# Patient Record
Sex: Female | Born: 1955 | Race: White | Hispanic: No | Smoking: Current every day smoker
Health system: Southern US, Community
[De-identification: ages and names within clinical notes are randomized; demographics above are authoritative.]

## PROBLEM LIST (undated history)

## (undated) DIAGNOSIS — F329 Major depressive disorder, single episode, unspecified: Secondary | ICD-10-CM

## (undated) DIAGNOSIS — F419 Anxiety disorder, unspecified: Secondary | ICD-10-CM

## (undated) DIAGNOSIS — Z8719 Personal history of other diseases of the digestive system: Secondary | ICD-10-CM

## (undated) DIAGNOSIS — R51 Headache: Secondary | ICD-10-CM

## (undated) DIAGNOSIS — K219 Gastro-esophageal reflux disease without esophagitis: Secondary | ICD-10-CM

## (undated) DIAGNOSIS — M1990 Unspecified osteoarthritis, unspecified site: Secondary | ICD-10-CM

## (undated) DIAGNOSIS — E079 Disorder of thyroid, unspecified: Secondary | ICD-10-CM

## (undated) DIAGNOSIS — E039 Hypothyroidism, unspecified: Secondary | ICD-10-CM

## (undated) DIAGNOSIS — R0602 Shortness of breath: Secondary | ICD-10-CM

## (undated) DIAGNOSIS — F32A Depression, unspecified: Secondary | ICD-10-CM

## (undated) DIAGNOSIS — I10 Essential (primary) hypertension: Secondary | ICD-10-CM

## (undated) DIAGNOSIS — J449 Chronic obstructive pulmonary disease, unspecified: Secondary | ICD-10-CM

## (undated) DIAGNOSIS — G4730 Sleep apnea, unspecified: Secondary | ICD-10-CM

## (undated) HISTORY — PX: CHOLECYSTECTOMY: SHX55

## (undated) HISTORY — PX: CARDIOVASCULAR STRESS TEST: SHX262

## (undated) HISTORY — PX: ABDOMINAL HYSTERECTOMY: SHX81

## (undated) HISTORY — PX: KNEE ARTHROSCOPY: SUR90

## (undated) HISTORY — PX: CARDIAC CATHETERIZATION: SHX172

---

## 2005-05-31 ENCOUNTER — Ambulatory Visit: Payer: Self-pay | Admitting: Cardiology

## 2005-06-01 ENCOUNTER — Inpatient Hospital Stay (HOSPITAL_COMMUNITY): Admission: AD | Admit: 2005-06-01 | Discharge: 2005-06-03 | Payer: Self-pay | Admitting: Cardiology

## 2005-06-02 ENCOUNTER — Ambulatory Visit: Payer: Self-pay | Admitting: Cardiology

## 2005-06-26 ENCOUNTER — Ambulatory Visit: Payer: Self-pay | Admitting: Cardiology

## 2006-12-24 ENCOUNTER — Ambulatory Visit (HOSPITAL_COMMUNITY): Admission: RE | Admit: 2006-12-24 | Discharge: 2006-12-24 | Payer: Self-pay | Admitting: *Deleted

## 2006-12-26 ENCOUNTER — Emergency Department (HOSPITAL_COMMUNITY): Admission: EM | Admit: 2006-12-26 | Discharge: 2006-12-26 | Payer: Self-pay | Admitting: Emergency Medicine

## 2009-03-22 ENCOUNTER — Ambulatory Visit: Payer: Self-pay | Admitting: Cardiology

## 2009-04-11 ENCOUNTER — Emergency Department (HOSPITAL_COMMUNITY): Admission: EM | Admit: 2009-04-11 | Discharge: 2009-04-11 | Payer: Self-pay | Admitting: Emergency Medicine

## 2009-04-22 ENCOUNTER — Ambulatory Visit (HOSPITAL_COMMUNITY): Admission: RE | Admit: 2009-04-22 | Discharge: 2009-04-22 | Payer: Self-pay | Admitting: Family Medicine

## 2010-09-11 ENCOUNTER — Encounter: Payer: Self-pay | Admitting: Family Medicine

## 2011-01-06 NOTE — Discharge Summary (Signed)
NAMERYANN, PAULI               ACCOUNT NO.:  [id number]   MEDICAL RECORD NO.:  [id number]          PATIENT TYPE:  INP   LOCATION:  4709                         FACILITY:  [facility]   PHYSICIAN:  April Humphrey, NP     DATE OF BIRTH:  [birth year]   DATE OF ADMISSION:  06/01/2005  DATE OF DISCHARGE:  06/03/2005                                 DISCHARGE SUMMARY   DISCHARGING CARDIOLOGIST:  Jonelle Sidle, MD, [facility].   PRINCIPAL DIAGNOSIS:  Chest pain with lateral ST elevation.   SECONDARY DIAGNOSES:  1.  Tobacco abuse/chronic obstructive pulmonary disease.  2.  Morbid obesity.  3.  Hypertension.  4.  Unknown lipid status.   PROCEDURES PERFORMED:  1.  June 02, 2005, left heart catheterization, coronaries,      ventriculogram.  2.  Chest CT on June 02, 2005.   ALLERGIES:  1.  PENICILLIN.  2.  IODINE.  3.  SULFA DRUGS.  4.  CODEINE.  5.  AVALOX.  6.  MORPHINE.  7.  IV DYE.   HISTORY OF PRESENT ILLNESS:  Patient is a 55 year old Caucasian female with  multiple medical problems.  She was originally admitted to [facility] on May 31, 2005, with a history of dizziness and presyncopal  episodes.  The patient also described substernal pressure, burning, and  radiation.  Her troponins at [facility] were negative x3.  The patient had a  dobutamine study with ST changes on May 31, 2005, at [facility] and was  transferred to the ICU on IV nitroglycerin and heparin but was decided to be  transferred to [facility] for additional cardiac catheterization.  The  patient indicates an IV DYE ALLERGY, also.  The patient was transferred on  June 01, 2005, to [facility] for a diagnostic cath in stable condition.  On June 02, 2005, the patient had a cardiac catheterization that showed  no significant obstructive CAD, next with an LVEF of 65%.  The patient was  then transferred to telemetry.  A chest CT was performed on October 14th to  exclude a PE.  On June 03, 2005, Dr. Diona Browner went to examine the  patient.  The CT showed negative PE or any other acute abnormality.  The  troponins were negative and chest pain was resolved.  The patient was stable  at that point to be discharged home.   DISCHARGE LABORATORY DATA:  CK-MBs were negative.  CBC:  White blood cell  count of 15.7, hemoglobin of 15.0, hematocrit of 44.0, platelet count of  353.   FOLLOWUP:  The patient will be followed up by Dr. Andee Lineman on November 6th at  2:45 for a post-hospital visit.  The patient is also to follow up with Dr.  Reuel Boom and is to call to schedule. Also, patient was also advised to try to  get an appointment with a pulmonologist for evaluation of her lung status.   DISCHARGE MEDICATIONS:  1.  Hyzaar 100/25 mg p.o. daily.  2.  Cardizem-CD 360 mg p.o. daily.  3.  Synthroid 0.6 mcg p.o. daily.  4.  Nexium  40 mg p.o. daily.  5.  Spiriva 18 mcg every day inhaler.  6.  Albuterol inhaler as previously ordered.  7.  Flexeril and Tylox as previously ordered.  8.  Aspirin 81 mg p.o. daily.  \.   INSTRUCTIONS:  The patient was also instructed to stop smoking and to eat a  heart-healthy diet.  The patient was instructed not to drive for two days,  no lifting for one week, may shower today, no bathing for one week.   TOTAL DURATION OF ENCOUNTER:  Less than 30 minutes.           ______________________________  April Humphrey, NP     AH/MEDQ  D:  06/03/2005  T:  06/03/2005  Job:  [id number]   cc:   Learta Codding, M.D. [facility]  [address]  [address]  [place]  [place] [address]   Donzetta Sprung  Fax: [phone]

## 2011-01-06 NOTE — Cardiovascular Report (Signed)
NAMERAYMONDE, Gabrielle Castillo               ACCOUNT NO.:  [id number]   MEDICAL RECORD NO.:  [id number]          PATIENT TYPE:  INP   LOCATION:  2913                         FACILITY:  [facility]   PHYSICIAN:  Jonelle Sidle, M.D. LHCDATE OF BIRTH:  Gabrielle Castillo, Gabrielle Castillo   DATE OF PROCEDURE:  06/02/2005  DATE OF DISCHARGE:                              CARDIAC CATHETERIZATION   PRIMARY CARE PHYSICIAN:  Dr. Donzetta Sprung.   PRIMARY CARDIOLOGIST:  Dr. Learta Codding.   INDICATION:  Gabrielle Castillo is a 55 year old woman with history of morbid  obesity with probable obstructive sleep apnea, hypertension, ongoing tobacco  use with COPD, gastroesophageal reflux disease, and recent presentation to  [facility] with chest discomfort.  She ruled out for  myocardial infarction and was subsequently referred for a dobutamine Myoview  study.  This study had to be discontinued due to development of chest pain  and per Dr. Margarita Mail report, the development of hyperacute T wave changes  (not ST elevation as has been described in the chart).  She is transferred  now to the [name] in anticipation of diagnostic coronary  angiography to clearly outline the coronary anatomy.  She does report an  anaphylactic contrast dye allergy in the past approximately 10 years ago.  She has been pretreated with prednisone, Pepcid and Benadryl, and has signed  her informed consent to proceed.   PROCEDURES PERFORMED:  1.  Left heart catheterization.  2.  Selective coronary angiography.  3.  Left ventriculography.   ACCESS AND EQUIPMENT:  The area about the right femoral artery was  anesthetized with 1% lidocaine and a 6-French sheath was placed in the right  femoral artery via the modified Seldinger technique.  Standard preformed 6-  [place] and JR4 catheters were used for selective coronary angiography  and an angled pigtail catheter was used for left heart catheterization and  left ventriculography.  All  exchanges were made over a wire and the patient  tolerated the procedure well without immediate complications.   HEMODYNAMIC RESULTS:  Left ventricle 148/12 mmHg.  Aorta 144/83 mmHg.   ANGIOGRAPHIC FINDINGS:  1.  The left main coronary artery is free of significant flow-limiting      coronary atherosclerosis and gives rise to the left anterior descending      and circumflex coronary arteries.  2.  The left anterior descending is a medium-to-large-caliber vessel with 2      large proximal diagonal branches.  No significant flow-limiting coronary      atherosclerosis is noted within this system.  3.  The circumflex coronary artery is a large codominant vessel.  There is a      large proximal obtuse marginal branch and 2 smaller more distal obtuse      marginal branches as well as a posterior descending branch.  No      significant flow-limiting coronary atherosclerosis is noted with only      minor luminal irregularities.  4.  The right coronary artery is a small-to-medium-caliber vessel that is      codominant with the circumflex.  A conus branch  is noted near the      ostium.  There are 2 small right ventricular marginal branches noted.      No significant flow-limiting coronary atherosclerosis is noted within      this system.   Left ventriculography was performed in the RAO projection and reveals an  ejection fraction of approximately 65% with no anterior apical or inferior  wall motion abnormalities and no significant mitral regurgitation.   DIAGNOSES:  1.  No significant flow-limiting coronary atherosclerosis noted in the major      epicardial vessels as outlined above.  The coronary system is      codominant.  2.  Left ventricular ejection fraction of approximately 65% with no anterior      apical or inferior wall motion abnormalities and a left ventricular end-      diastolic pressure of 12 mmHg.   DISCUSSION:  Plan will be for the patient to be transferred to the telemetry   unit with followup lab work in the morning as well as electrocardiogram.  She will be observed overnight and we will plan a CT scan of the chest with  contrast for tomorrow following adequate contrast dye allergy prophylaxis to  exclude possibilities of pulmonary mass, pulmonary embolus and abnormalities  of the great vessels.  If this is negative, she will likely be discharged  for close outpatient followup.           ______________________________  Jonelle Sidle, M.D. [facility]     SGM/MEDQ  D:  06/02/2005  T:  06/02/2005  Job:  [id number]   cc:   Donzetta Sprung  Fax: [phone]   Learta Codding, M.D. [facility]  [address]  [address]  [place]  [place] [address]

## 2011-07-05 ENCOUNTER — Emergency Department (HOSPITAL_COMMUNITY): Payer: Medicare Other

## 2011-07-05 ENCOUNTER — Encounter: Payer: Self-pay | Admitting: *Deleted

## 2011-07-05 ENCOUNTER — Emergency Department (HOSPITAL_COMMUNITY)
Admission: EM | Admit: 2011-07-05 | Discharge: 2011-07-05 | Disposition: A | Discharge status: Home / Self Care | Payer: Medicare Other | Attending: Emergency Medicine | Admitting: Emergency Medicine

## 2011-07-05 DIAGNOSIS — S7001XA Contusion of right hip, initial encounter: Secondary | ICD-10-CM

## 2011-07-05 DIAGNOSIS — W19XXXA Unspecified fall, initial encounter: Secondary | ICD-10-CM | POA: Insufficient documentation

## 2011-07-05 DIAGNOSIS — S7000XA Contusion of unspecified hip, initial encounter: Secondary | ICD-10-CM | POA: Insufficient documentation

## 2011-07-05 DIAGNOSIS — M79609 Pain in unspecified limb: Secondary | ICD-10-CM | POA: Insufficient documentation

## 2011-07-05 HISTORY — DX: Disorder of thyroid, unspecified: E07.9

## 2011-07-05 HISTORY — DX: Chronic obstructive pulmonary disease, unspecified: J44.9

## 2011-07-05 HISTORY — DX: Essential (primary) hypertension: I10

## 2011-07-05 HISTORY — DX: Unspecified osteoarthritis, unspecified site: M19.90

## 2011-07-05 LAB — POCT I-STAT, CHEM 8
BUN: 9 mg/dL (ref 6–23)
Calcium, Ion: 1.13 mmol/L (ref 1.12–1.32)
Chloride: 105 mEq/L (ref 96–112)
Creatinine, Ser: 0.7 mg/dL (ref 0.50–1.10)
Glucose, Bld: 118 mg/dL — ABNORMAL HIGH (ref 70–99)
HCT: 42 % (ref 36.0–46.0)
Hemoglobin: 14.3 g/dL (ref 12.0–15.0)
Potassium: 3.5 mEq/L (ref 3.5–5.1)
Sodium: 143 mEq/L (ref 135–145)
TCO2: 23 mmol/L (ref 0–100)

## 2011-07-05 MED ORDER — HYDROCODONE-ACETAMINOPHEN 5-500 MG PO TABS: 1.0000 | ORAL_TABLET | Freq: Four times a day (QID) | ORAL | Status: AC | PRN

## 2011-07-05 MED ORDER — HYDROMORPHONE HCL PF 2 MG/ML IJ SOLN
INTRAMUSCULAR | Status: AC
  Administered 2011-07-05: 1 mg
  Filled 2011-07-05: qty 1

## 2011-07-05 MED ORDER — ACETAMINOPHEN 325 MG PO TABS
650.0000 mg | ORAL_TABLET | Freq: Once | ORAL | Status: AC
  Administered 2011-07-05: 650 mg via ORAL
  Filled 2011-07-05: qty 2

## 2011-07-05 MED ORDER — HYDROMORPHONE HCL PF 1 MG/ML IJ SOLN
1.0000 mg | Freq: Once | INTRAMUSCULAR | Status: AC
  Administered 2011-07-05: 1 mg via INTRAVENOUS
  Filled 2011-07-05: qty 1

## 2011-07-05 MED ORDER — ONDANSETRON 8 MG PO TBDP
8.0000 mg | ORAL_TABLET | Freq: Once | ORAL | Status: AC
  Administered 2011-07-05: 8 mg via ORAL
  Filled 2011-07-05: qty 2

## 2011-07-05 MED ORDER — HYDROMORPHONE HCL PF 1 MG/ML IJ SOLN
1.0000 mg | Freq: Once | INTRAMUSCULAR | Status: AC
  Administered 2011-07-05: 1 mg via INTRAMUSCULAR

## 2011-07-05 NOTE — ED Notes (Signed)
Assessment of patient: Complaints of right later thigh pain. No deformity. Strong petal pulses. No discoloration. Sharp in nature that is constant; movement makes pain worse. Nothing makes pain better. Lower back pain as well that is sharp and constant. Movement makes pain worse. Nothing makes pain better. Equal chest rise and fall. Lung sounds clear and equal bilaterally. No respiratory distress.

## 2011-07-05 NOTE — ED Notes (Signed)
Pt back from x-ray.

## 2011-07-05 NOTE — ED Notes (Signed)
MD at bedside. 

## 2011-07-05 NOTE — ED Notes (Signed)
Called ems to pick up pt, they are sending someone

## 2011-07-05 NOTE — ED Notes (Addendum)
Had a fall around 0430 this morning. Was moving from a computer chair back to her wheelchair with her cane when the cane buckled and she fell to her knees. Denies hitting head or losing consciousness. Refused C-Collar by EMS. Was laying flat in floor when EMS arrived. Complaints of right lateral thigh pain and lower back pain. No deformity. Strong petal pulses. States she has had the trouble for a long time of not having any feeling in bilateral arms and legs.

## 2011-07-05 NOTE — ED Notes (Signed)
Patient transported to X-ray via stretcher 

## 2011-07-05 NOTE — ED Notes (Signed)
MD at bedside. Jenette, RN at bedside to draw blood.

## 2011-07-05 NOTE — ED Provider Notes (Signed)
History     CSN: [id number] Arrival date & time: 07/05/2011  6:21 AM   First MD Initiated Contact with Patient 07/05/11 0617      Chief Complaint  Patient presents with  . Fall    (Consider location/radiation/quality/duration/timing/severity/associated sxs/prior treatment) Patient is a 55 y.o. female presenting with fall. The history is provided by the patient and the EMS personnel.  Fall The accident occurred less than 1 hour ago. The fall occurred while walking. Distance fallen: Ground level. She landed on carpet. There was no blood loss. The point of impact was the right hip. The pain is present in the right hip. The pain is moderate. She was not ambulatory at the scene. There was no entrapment after the fall. There was no drug use involved in the accident. There was no alcohol use involved in the accident. Pertinent negatives include no visual change, no fever, no numbness, no abdominal pain, no bowel incontinence, no nausea, no vomiting, no hematuria, no headaches, no loss of consciousness and no tingling. The symptoms are aggravated by use of the injured limb. Prehospitalization: Patient declined a c-collar, backboard. No IV or medications provided. She has tried nothing for the symptoms.   Patient uses a wheelchair at home as needed and also a 4 pronged cane. She is getting out of her wheelchair and using her cane to get her computer desk when she fell landing on her right hip and now has pain in the area of her right hip. No weakness or tingling. Family called EMS and she did not try to get up. She denies any other pain or injury. Specifically no neck pain or head pain. She denies striking her head. She did not injure her arm wrist or elbow. She has a history of back pain that persists at night but she denies any new back pain. She has been seeing her doctor, DR Reuel Boom in [place], and recently with a complaint of numbness in her extremities. He has given her a prescription for the wheelchair  has been checking blood work. Patient is unaware of the results of her blood work. She denies any recent illness. She denies any weakness in her arms or legs. This has been gone for a matter of weeks. No fevers or GI illness or history of Guillian Barre syndrome. She has not seen a neurologist. Her symptoms are persistent but not progressive. She is requesting something for pain.  Past Medical History  Diagnosis Date  . COPD (chronic obstructive pulmonary disease)   . Hypertension   . Thyroid disease   . Emphysema   . Asthma   . Degenerative joint disease     Past Surgical History  Procedure Date  . Abdominal hysterectomy   . Cholecystectomy   . Cardiac catheterization     three times  . Cardiac catheterization     History reviewed. No pertinent family history.  History  Substance Use Topics  . Smoking status: Unknown If Ever Smoked  . Smokeless tobacco: Not on file  . Alcohol Use: Not on file    OB History    Grav Para Term Preterm Abortions TAB SAB Ect Mult Living                  Review of Systems  Constitutional: Negative for fever and chills.  HENT: Negative for neck pain and neck stiffness.   Eyes: Negative for pain.  Respiratory: Negative for shortness of breath.   Cardiovascular: Negative for chest pain, palpitations and leg  swelling.  Gastrointestinal: Negative for nausea, vomiting, abdominal pain and bowel incontinence.  Genitourinary: Negative for dysuria and hematuria.  Musculoskeletal: Negative for back pain.  Skin: Negative for rash.  Neurological: Negative for tingling, loss of consciousness, numbness and headaches.  All other systems reviewed and are negative.    Allergies  Iohexol  Home Medications   Current Outpatient Rx  Name Route Sig Dispense Refill  . AMLODIPINE-OLMESARTAN 10-40 MG PO TABS Oral Take 1 tablet by mouth daily.      . BUPROPION HCL ER (XL) 150 MG PO TB24 Oral Take 150 mg by mouth daily.      Marland Kitchen FLUTICASONE-SALMETEROL 115-21  MCG/ACT IN AERO Inhalation Inhale 2 puffs into the lungs 2 (two) times daily.      . FUROSEMIDE 40 MG PO TABS Oral Take 40 mg by mouth daily.      Marland Kitchen LEVOTHYROXINE SODIUM 300 MCG PO TABS Oral Take 600 mcg by mouth daily.      Marland Kitchen LORAZEPAM 0.5 MG PO TABS Oral Take 0.5 mg by mouth every 8 (eight) hours.      Marland Kitchen METHOCARBAMOL 750 MG PO TABS Oral Take 750 mg by mouth 3 (three) times daily as needed.      Marland Kitchen NABUMETONE 750 MG PO TABS Oral Take 1,500 mg by mouth daily.      Marland Kitchen OMEPRAZOLE 20 MG PO CPDR Oral Take 20 mg by mouth daily.      . PSYLLIUM 0.52 G PO CAPS Oral Take 0.52 g by mouth daily.      Marland Kitchen TIOTROPIUM BROMIDE MONOHYDRATE 18 MCG IN CAPS Inhalation Place 18 mcg into inhaler and inhale daily.      . TOLTERODINE TARTRATE 4 MG PO CP24 Oral Take 4 mg by mouth daily.        BP 147/70  Pulse 98  Temp(Src) 97.9 F (36.6 C) (Oral)  Resp 20  Ht 5\' 7"  (1.702 m)  Wt 358 lb (162.388 kg)  BMI 56.07 kg/m2  SpO2 97%  Physical Exam  Constitutional: She is oriented to person, place, and time. She appears well-developed and well-nourished.  HENT:  Head: Normocephalic and atraumatic.  Eyes: Conjunctivae and EOM are normal. Pupils are equal, round, and reactive to light.  Neck: Trachea normal. Neck supple. No thyromegaly present.       No midline cervical, thoracic or lumbar tenderness or deformity.  Cardiovascular: Normal rate, regular rhythm, S1 normal, S2 normal and normal pulses.     No systolic murmur is present   No diastolic murmur is present  Pulses:      Radial pulses are 2+ on the right side, and 2+ on the left side.  Pulmonary/Chest: Effort normal and breath sounds normal. She has no wheezes. She has no rhonchi. She has no rales. She exhibits no tenderness.  Abdominal: Soft. Normal appearance and bowel sounds are normal. There is no tenderness. There is no CVA tenderness and negative Murphy's sign.  Musculoskeletal:       Pelvis stable. Tender over area of right greater trochanter. No  lower extremity rotation or deformity sensorium to light touch is intact. No tenderness over knee ankle or foot. Skin intact. Distal motor and pulses equal and intact.  Neurological: She is alert and oriented to person, place, and time. She has normal strength. No cranial nerve deficit or sensory deficit. GCS eye subscore is 4. GCS verbal subscore is 5. GCS motor subscore is 6.  Skin: Skin is warm and dry. No rash noted. She  is not diaphoretic.  Psychiatric: Her speech is normal.       Cooperative and appropriate    ED Course  Procedures (including critical care time)   Room air pulse ox 97%: adequate  Results for orders placed during the hospital encounter of 07/05/11  POCT I-STAT, CHEM 8      Component Value Range   Sodium 143  135 - 145 (mEq/L)   Potassium 3.5  3.5 - 5.1 (mEq/L)   Chloride 105  96 - 112 (mEq/L)   BUN 9  6 - 23 (mg/dL)   Creatinine, Ser 1.61  0.50 - 1.10 (mg/dL)   Glucose, Bld 096 (*) 70 - 99 (mg/dL)   Calcium, Ion 0.45  4.09 - 1.32 (mmol/L)   TCO2 23  0 - 100 (mmol/L)   Hemoglobin 14.3  12.0 - 15.0 (g/dL)   HCT 81.1  91.4 - 78.2 (%)   Dg Hip Complete Right  07/05/2011  *RADIOLOGY REPORT*  Clinical Data: Status post fall; right lateral thigh pain.  RIGHT HIP - COMPLETE 2+ VIEW  Comparison: None.  Findings: There is no evidence of fracture or dislocation.  Both femoral heads are seated normally within their respective acetabula.  The proximal right femur remains intact.  No significant degenerative change is appreciated.  Mild sclerotic change is noted at the sacroiliac joints.  The visualized bowel gas pattern is grossly unremarkable in appearance.  Scattered phleboliths are noted within the pelvis.  IMPRESSION: No evidence of fracture or dislocation.  Original Report Authenticated By: Tonia Ghent, M.D.    Labs reviewed as above. No anemia. No significant hyperglycemia. No electrolyte abnormality. Pain control in the ED IV dilaudid. Plan primary care followup and  referral to neurology given her persistent symptoms of numbness in her extremities.  No fracture on x-rays. Patient stable for discharge home   MDM   Pain control with IV Narcotics. Labs and imaging obtained and reviewed as above. Nursing notes reviewed. Vital signs reviewed.       Sunnie Nielsen, MD 07/05/11 [phone]

## 2011-07-05 NOTE — ED Notes (Signed)
Pt reports still in a lot of pain.  Notified edp

## 2011-10-10 ENCOUNTER — Other Ambulatory Visit: Payer: Self-pay | Admitting: Neurosurgery

## 2011-10-16 ENCOUNTER — Other Ambulatory Visit (HOSPITAL_COMMUNITY): Payer: Medicare Other

## 2011-10-17 ENCOUNTER — Encounter (HOSPITAL_COMMUNITY): Payer: Self-pay | Admitting: *Deleted

## 2011-10-17 MED ORDER — CEFAZOLIN SODIUM-DEXTROSE 2-3 GM-% IV SOLR
2.0000 g | INTRAVENOUS | Status: DC
  Filled 2011-10-17: qty 50

## 2011-10-18 ENCOUNTER — Inpatient Hospital Stay (HOSPITAL_COMMUNITY): Payer: Medicare Other | Admitting: Anesthesiology

## 2011-10-18 ENCOUNTER — Encounter (HOSPITAL_COMMUNITY): Payer: Self-pay | Admitting: Anesthesiology

## 2011-10-18 ENCOUNTER — Other Ambulatory Visit: Payer: Self-pay

## 2011-10-18 ENCOUNTER — Inpatient Hospital Stay (HOSPITAL_COMMUNITY)
Admission: RE | Admit: 2011-10-18 | Discharge: 2011-10-21 | DRG: 472 | Disposition: A | Discharge status: Home Health | Payer: Medicare Other | Source: Ambulatory Visit | Attending: Neurosurgery | Admitting: Neurosurgery

## 2011-10-18 ENCOUNTER — Inpatient Hospital Stay (HOSPITAL_COMMUNITY): Payer: Medicare Other

## 2011-10-18 ENCOUNTER — Encounter (HOSPITAL_COMMUNITY)
Admission: RE | Disposition: A | Discharge status: Home Health | Payer: Self-pay | Source: Ambulatory Visit | Attending: Neurosurgery

## 2011-10-18 ENCOUNTER — Encounter (HOSPITAL_COMMUNITY): Payer: Self-pay | Admitting: *Deleted

## 2011-10-18 ENCOUNTER — Encounter (HOSPITAL_COMMUNITY): Payer: Self-pay | Admitting: Certified Registered Nurse Anesthetist

## 2011-10-18 DIAGNOSIS — M5000 Cervical disc disorder with myelopathy, unspecified cervical region: Principal | ICD-10-CM | POA: Diagnosis present

## 2011-10-18 DIAGNOSIS — Z886 Allergy status to analgesic agent status: Secondary | ICD-10-CM

## 2011-10-18 DIAGNOSIS — Z6841 Body Mass Index (BMI) 40.0 and over, adult: Secondary | ICD-10-CM

## 2011-10-18 DIAGNOSIS — M47812 Spondylosis without myelopathy or radiculopathy, cervical region: Secondary | ICD-10-CM

## 2011-10-18 DIAGNOSIS — Z882 Allergy status to sulfonamides status: Secondary | ICD-10-CM

## 2011-10-18 DIAGNOSIS — Z880 Allergy status to penicillin: Secondary | ICD-10-CM

## 2011-10-18 DIAGNOSIS — I10 Essential (primary) hypertension: Secondary | ICD-10-CM | POA: Diagnosis present

## 2011-10-18 DIAGNOSIS — J4489 Other specified chronic obstructive pulmonary disease: Secondary | ICD-10-CM | POA: Diagnosis present

## 2011-10-18 DIAGNOSIS — K449 Diaphragmatic hernia without obstruction or gangrene: Secondary | ICD-10-CM | POA: Diagnosis present

## 2011-10-18 DIAGNOSIS — K219 Gastro-esophageal reflux disease without esophagitis: Secondary | ICD-10-CM | POA: Diagnosis present

## 2011-10-18 DIAGNOSIS — M4802 Spinal stenosis, cervical region: Secondary | ICD-10-CM | POA: Diagnosis present

## 2011-10-18 DIAGNOSIS — J449 Chronic obstructive pulmonary disease, unspecified: Secondary | ICD-10-CM | POA: Diagnosis present

## 2011-10-18 DIAGNOSIS — F17200 Nicotine dependence, unspecified, uncomplicated: Secondary | ICD-10-CM | POA: Diagnosis present

## 2011-10-18 DIAGNOSIS — Z91041 Radiographic dye allergy status: Secondary | ICD-10-CM

## 2011-10-18 HISTORY — DX: Shortness of breath: R06.02

## 2011-10-18 HISTORY — DX: Personal history of other diseases of the digestive system: Z87.19

## 2011-10-18 HISTORY — PX: ANTERIOR CERVICAL DECOMP/DISCECTOMY FUSION: SHX1161

## 2011-10-18 HISTORY — DX: Depression, unspecified: F32.A

## 2011-10-18 HISTORY — DX: Gastro-esophageal reflux disease without esophagitis: K21.9

## 2011-10-18 HISTORY — DX: Headache: R51

## 2011-10-18 HISTORY — DX: Major depressive disorder, single episode, unspecified: F32.9

## 2011-10-18 HISTORY — DX: Hypothyroidism, unspecified: E03.9

## 2011-10-18 HISTORY — DX: Anxiety disorder, unspecified: F41.9

## 2011-10-18 HISTORY — DX: Sleep apnea, unspecified: G47.30

## 2011-10-18 LAB — CBC
HCT: 41.6 % (ref 36.0–46.0)
MCH: 30.9 pg (ref 26.0–34.0)
MCHC: 35.3 g/dL (ref 30.0–36.0)
RDW: 12.7 % (ref 11.5–15.5)

## 2011-10-18 LAB — BASIC METABOLIC PANEL
BUN: 12 mg/dL (ref 6–23)
Creatinine, Ser: 0.81 mg/dL (ref 0.50–1.10)
GFR calc Af Amer: >90 mL/min (ref >90)
GFR calc non Af Amer: 80 mL/min — ABNORMAL LOW (ref >90)

## 2011-10-18 LAB — SURGICAL PCR SCREEN
MRSA, PCR: NEGATIVE
Staphylococcus aureus: POSITIVE — AB

## 2011-10-18 SURGERY — ANTERIOR CERVICAL DECOMPRESSION/DISCECTOMY FUSION 1 LEVEL
Anesthesia: General | Site: Neck | Laterality: Bilateral | Wound class: Clean

## 2011-10-18 MED ORDER — DIAZEPAM 5 MG PO TABS
5.0000 mg | ORAL_TABLET | Freq: Four times a day (QID) | ORAL | Status: DC | PRN
  Administered 2011-10-18 – 2011-10-21 (×5): 5 mg via ORAL
  Filled 2011-10-18 (×5): qty 1

## 2011-10-18 MED ORDER — CEFAZOLIN SODIUM 1-5 GM-% IV SOLN: 1.0000 g | Freq: Three times a day (TID) | INTRAVENOUS | Status: DC

## 2011-10-18 MED ORDER — IRBESARTAN 300 MG PO TABS
300.0000 mg | ORAL_TABLET | Freq: Every day | ORAL | Status: DC
  Administered 2011-10-19 – 2011-10-21 (×2): 300 mg via ORAL
  Filled 2011-10-18 (×4): qty 1

## 2011-10-18 MED ORDER — ONDANSETRON HCL 4 MG/2ML IJ SOLN: 4.0000 mg | Freq: Once | INTRAMUSCULAR | Status: DC | PRN

## 2011-10-18 MED ORDER — SODIUM CHLORIDE 0.9 % IV SOLN
INTRAVENOUS | Status: DC
  Administered 2011-10-18 – 2011-10-19 (×2): via INTRAVENOUS

## 2011-10-18 MED ORDER — ACETAMINOPHEN 650 MG RE SUPP: 650.0000 mg | RECTAL | Status: DC | PRN

## 2011-10-18 MED ORDER — GLYCOPYRROLATE 0.2 MG/ML IJ SOLN
INTRAMUSCULAR | Status: DC | PRN
  Administered 2011-10-18: 1 mg via INTRAVENOUS

## 2011-10-18 MED ORDER — 0.9 % SODIUM CHLORIDE (POUR BTL) OPTIME
TOPICAL | Status: DC | PRN
  Administered 2011-10-18: 1000 mL

## 2011-10-18 MED ORDER — BUPROPION HCL ER (XL) 150 MG PO TB24
150.0000 mg | ORAL_TABLET | Freq: Every day | ORAL | Status: DC
  Administered 2011-10-19 – 2011-10-21 (×3): 150 mg via ORAL
  Filled 2011-10-18 (×4): qty 1

## 2011-10-18 MED ORDER — ALBUTEROL SULFATE HFA 108 (90 BASE) MCG/ACT IN AERS
INHALATION_SPRAY | RESPIRATORY_TRACT | Status: DC | PRN
  Administered 2011-10-18: 2 via RESPIRATORY_TRACT
  Administered 2011-10-18: 4 via RESPIRATORY_TRACT

## 2011-10-18 MED ORDER — ZOLPIDEM TARTRATE 5 MG PO TABS
5.0000 mg | ORAL_TABLET | Freq: Every evening | ORAL | Status: DC | PRN
  Administered 2011-10-19: 5 mg via ORAL
  Filled 2011-10-18: qty 1

## 2011-10-18 MED ORDER — HYDROMORPHONE HCL PF 1 MG/ML IJ SOLN
INTRAMUSCULAR | Status: AC
  Filled 2011-10-18: qty 1

## 2011-10-18 MED ORDER — AMLODIPINE-OLMESARTAN 10-40 MG PO TABS: 1.0000 | ORAL_TABLET | Freq: Every day | ORAL | Status: DC

## 2011-10-18 MED ORDER — SODIUM CHLORIDE 0.9 % IV SOLN
INTRAVENOUS | Status: DC | PRN
  Administered 2011-10-18: 13:00:00 via INTRAVENOUS

## 2011-10-18 MED ORDER — MENTHOL 3 MG MT LOZG: 1.0000 | LOZENGE | OROMUCOSAL | Status: DC | PRN

## 2011-10-18 MED ORDER — HEMOSTATIC AGENTS (NO CHARGE) OPTIME
TOPICAL | Status: DC | PRN
  Administered 2011-10-18: 1 via TOPICAL

## 2011-10-18 MED ORDER — SODIUM CHLORIDE 0.9 % IV SOLN: 250.0000 mL | INTRAVENOUS | Status: DC

## 2011-10-18 MED ORDER — OXYCODONE-ACETAMINOPHEN 5-325 MG PO TABS
1.0000 | ORAL_TABLET | ORAL | Status: DC | PRN
  Administered 2011-10-18: 2 via ORAL
  Administered 2011-10-20: 1 via ORAL
  Administered 2011-10-20: 2 via ORAL
  Administered 2011-10-21: 1 via ORAL
  Administered 2011-10-21: 2 via ORAL
  Filled 2011-10-18 (×2): qty 2
  Filled 2011-10-18: qty 1
  Filled 2011-10-18 (×2): qty 2

## 2011-10-18 MED ORDER — FUROSEMIDE 40 MG PO TABS
40.0000 mg | ORAL_TABLET | Freq: Every day | ORAL | Status: DC
  Administered 2011-10-19 – 2011-10-21 (×2): 40 mg via ORAL
  Filled 2011-10-18 (×4): qty 1

## 2011-10-18 MED ORDER — HYDROMORPHONE HCL PF 1 MG/ML IJ SOLN: 0.2500 mg | INTRAMUSCULAR | Status: DC | PRN

## 2011-10-18 MED ORDER — LACTATED RINGERS IV SOLN
INTRAVENOUS | Status: DC | PRN
  Administered 2011-10-18 (×2): via INTRAVENOUS

## 2011-10-18 MED ORDER — LIDOCAINE HCL (CARDIAC) 20 MG/ML IV SOLN
INTRAVENOUS | Status: DC | PRN
  Administered 2011-10-18: 100 mg via INTRAVENOUS

## 2011-10-18 MED ORDER — FENTANYL CITRATE 0.05 MG/ML IJ SOLN
INTRAMUSCULAR | Status: DC | PRN
  Administered 2011-10-18: 250 ug via INTRAVENOUS
  Administered 2011-10-18 (×2): 25 ug via INTRAVENOUS

## 2011-10-18 MED ORDER — MORPHINE SULFATE 4 MG/ML IJ SOLN
4.0000 mg | INTRAMUSCULAR | Status: DC | PRN
  Administered 2011-10-18 – 2011-10-20 (×6): 4 mg via INTRAVENOUS
  Filled 2011-10-18 (×6): qty 1

## 2011-10-18 MED ORDER — TOLTERODINE TARTRATE ER 4 MG PO CP24
4.0000 mg | ORAL_CAPSULE | Freq: Every day | ORAL | Status: DC
  Administered 2011-10-19 – 2011-10-21 (×3): 4 mg via ORAL
  Filled 2011-10-18 (×4): qty 1

## 2011-10-18 MED ORDER — ONDANSETRON HCL 4 MG/2ML IJ SOLN
4.0000 mg | INTRAMUSCULAR | Status: DC | PRN
  Administered 2011-10-19 – 2011-10-20 (×4): 4 mg via INTRAVENOUS
  Filled 2011-10-18 (×4): qty 2

## 2011-10-18 MED ORDER — MUPIROCIN 2 % EX OINT
TOPICAL_OINTMENT | CUTANEOUS | Status: AC
  Administered 2011-10-18: 11:00:00
  Filled 2011-10-18: qty 22

## 2011-10-18 MED ORDER — DEXAMETHASONE 4 MG PO TABS
4.0000 mg | ORAL_TABLET | Freq: Four times a day (QID) | ORAL | Status: DC
  Administered 2011-10-18 – 2011-10-21 (×10): 4 mg via ORAL
  Filled 2011-10-18 (×15): qty 1

## 2011-10-18 MED ORDER — AMLODIPINE BESYLATE 10 MG PO TABS
10.0000 mg | ORAL_TABLET | Freq: Every day | ORAL | Status: DC
  Administered 2011-10-19 – 2011-10-21 (×2): 10 mg via ORAL
  Filled 2011-10-18 (×4): qty 1

## 2011-10-18 MED ORDER — DEXAMETHASONE SODIUM PHOSPHATE 4 MG/ML IJ SOLN
4.0000 mg | Freq: Four times a day (QID) | INTRAMUSCULAR | Status: DC
  Administered 2011-10-19 (×2): 4 mg via INTRAVENOUS
  Filled 2011-10-18 (×12): qty 1

## 2011-10-18 MED ORDER — PHENYLEPHRINE HCL 10 MG/ML IJ SOLN
10.0000 mg | INTRAVENOUS | Status: DC | PRN
  Administered 2011-10-18: 20 ug/min via INTRAVENOUS

## 2011-10-18 MED ORDER — ACETAMINOPHEN 325 MG PO TABS
650.0000 mg | ORAL_TABLET | ORAL | Status: DC | PRN
  Administered 2011-10-19 – 2011-10-21 (×2): 650 mg via ORAL
  Filled 2011-10-18 (×2): qty 2

## 2011-10-18 MED ORDER — NEOSTIGMINE METHYLSULFATE 1 MG/ML IJ SOLN
INTRAMUSCULAR | Status: DC | PRN
  Administered 2011-10-18: 5 mg via INTRAVENOUS

## 2011-10-18 MED ORDER — PANTOPRAZOLE SODIUM 40 MG PO TBEC
40.0000 mg | DELAYED_RELEASE_TABLET | Freq: Every day | ORAL | Status: DC
  Administered 2011-10-18 – 2011-10-21 (×4): 40 mg via ORAL
  Filled 2011-10-18 (×3): qty 1

## 2011-10-18 MED ORDER — MIDAZOLAM HCL 5 MG/5ML IJ SOLN
INTRAMUSCULAR | Status: DC | PRN
  Administered 2011-10-18 (×2): 1 mg via INTRAVENOUS

## 2011-10-18 MED ORDER — SODIUM CHLORIDE 0.9 % IJ SOLN
3.0000 mL | Freq: Two times a day (BID) | INTRAMUSCULAR | Status: DC
  Administered 2011-10-18 – 2011-10-21 (×3): 3 mL via INTRAVENOUS

## 2011-10-18 MED ORDER — ONDANSETRON HCL 4 MG/2ML IJ SOLN
INTRAMUSCULAR | Status: DC | PRN
  Administered 2011-10-18: 4 mg via INTRAVENOUS

## 2011-10-18 MED ORDER — PHENOL 1.4 % MT LIQD
1.0000 | OROMUCOSAL | Status: DC | PRN
  Administered 2011-10-18 – 2011-10-19 (×2): 1 via OROMUCOSAL
  Filled 2011-10-18: qty 177

## 2011-10-18 MED ORDER — LEVOTHYROXINE SODIUM 150 MCG PO TABS
300.0000 ug | ORAL_TABLET | Freq: Every day | ORAL | Status: DC
  Administered 2011-10-19 – 2011-10-21 (×3): 300 ug via ORAL
  Filled 2011-10-18 (×5): qty 2

## 2011-10-18 MED ORDER — SODIUM CHLORIDE 0.9 % IJ SOLN: 3.0000 mL | INTRAMUSCULAR | Status: DC | PRN

## 2011-10-18 MED ORDER — THROMBIN 5000 UNITS EX SOLR
OROMUCOSAL | Status: DC | PRN
  Administered 2011-10-18: 15:00:00 via TOPICAL

## 2011-10-18 MED ORDER — HYDROMORPHONE HCL PF 1 MG/ML IJ SOLN
0.2500 mg | INTRAMUSCULAR | Status: DC | PRN
  Administered 2011-10-18 (×4): 0.5 mg via INTRAVENOUS

## 2011-10-18 MED ORDER — DOCUSATE SODIUM 100 MG PO CAPS
100.0000 mg | ORAL_CAPSULE | Freq: Two times a day (BID) | ORAL | Status: DC
  Administered 2011-10-18 – 2011-10-21 (×5): 100 mg via ORAL
  Filled 2011-10-18 (×4): qty 1

## 2011-10-18 MED ORDER — PROPOFOL 10 MG/ML IV EMUL
INTRAVENOUS | Status: DC | PRN
  Administered 2011-10-18: 300 mg via INTRAVENOUS
  Administered 2011-10-18: 30 mg via INTRAVENOUS

## 2011-10-18 MED ORDER — VANCOMYCIN HCL 1000 MG IV SOLR
1500.0000 mg | Freq: Once | INTRAVENOUS | Status: AC
  Administered 2011-10-18: 1500 mg via INTRAVENOUS
  Filled 2011-10-18 (×2): qty 1500

## 2011-10-18 MED ORDER — TIOTROPIUM BROMIDE MONOHYDRATE 18 MCG IN CAPS
18.0000 ug | ORAL_CAPSULE | Freq: Every day | RESPIRATORY_TRACT | Status: DC
  Administered 2011-10-18 – 2011-10-21 (×4): 18 ug via RESPIRATORY_TRACT
  Filled 2011-10-18: qty 5

## 2011-10-18 MED ORDER — THROMBIN 5000 UNITS EX KIT
PACK | CUTANEOUS | Status: DC | PRN
  Administered 2011-10-18 (×3): 5000 [IU] via TOPICAL

## 2011-10-18 MED ORDER — ROCURONIUM BROMIDE 100 MG/10ML IV SOLN
INTRAVENOUS | Status: DC | PRN
  Administered 2011-10-18: 100 mg via INTRAVENOUS

## 2011-10-18 SURGICAL SUPPLY — 63 items
APL SKNCLS STERI-STRIP NONHPOA (GAUZE/BANDAGES/DRESSINGS) ×1
BANDAGE GAUZE ELAST BULKY 4 IN (GAUZE/BANDAGES/DRESSINGS) ×2 IMPLANT
BENZOIN TINCTURE PRP APPL 2/3 (GAUZE/BANDAGES/DRESSINGS) ×2 IMPLANT
BIT DRILL SPINE QC 12 (BIT) ×1 IMPLANT
BLADE ULTRA TIP 2M (BLADE) ×2 IMPLANT
BUR BARREL STRAIGHT FLUTE 4.0 (BURR) IMPLANT
BUR MATCHSTICK NEURO 3.0 LAGG (BURR) ×2 IMPLANT
CANISTER SUCTION 2500CC (MISCELLANEOUS) ×2 IMPLANT
CLOTH BEACON ORANGE TIMEOUT ST (SAFETY) ×2 IMPLANT
CONT SPEC 4OZ CLIKSEAL STRL BL (MISCELLANEOUS) ×2 IMPLANT
COVER MAYO STAND STRL (DRAPES) ×2 IMPLANT
DRAIN JACKSON PRATT 10MM FLAT (MISCELLANEOUS) ×1 IMPLANT
DRAPE LAPAROTOMY 100X72 PEDS (DRAPES) ×2 IMPLANT
DRAPE MICROSCOPE LEICA (MISCELLANEOUS) ×2 IMPLANT
DRAPE POUCH INSTRU U-SHP 10X18 (DRAPES) ×2 IMPLANT
DURAPREP 6ML APPLICATOR 50/CS (WOUND CARE) ×2 IMPLANT
ELECT REM PT RETURN 9FT ADLT (ELECTROSURGICAL) ×2
ELECTRODE REM PT RTRN 9FT ADLT (ELECTROSURGICAL) ×1 IMPLANT
EVACUATOR SILICONE 100CC (DRAIN) ×1 IMPLANT
GAUZE SPONGE 4X4 12PLY STRL LF (GAUZE/BANDAGES/DRESSINGS) ×1 IMPLANT
GAUZE SPONGE 4X4 16PLY XRAY LF (GAUZE/BANDAGES/DRESSINGS) IMPLANT
GLOVE BIOGEL M 8.0 STRL (GLOVE) ×2 IMPLANT
GLOVE BIOGEL PI IND STRL 7.0 (GLOVE) IMPLANT
GLOVE BIOGEL PI IND STRL 7.5 (GLOVE) IMPLANT
GLOVE BIOGEL PI IND STRL 8 (GLOVE) IMPLANT
GLOVE BIOGEL PI INDICATOR 7.0 (GLOVE) ×1
GLOVE BIOGEL PI INDICATOR 7.5 (GLOVE) ×1
GLOVE BIOGEL PI INDICATOR 8 (GLOVE) ×1
GLOVE EXAM NITRILE LRG STRL (GLOVE) IMPLANT
GLOVE EXAM NITRILE MD LF STRL (GLOVE) IMPLANT
GLOVE EXAM NITRILE XL STR (GLOVE) IMPLANT
GLOVE EXAM NITRILE XS STR PU (GLOVE) IMPLANT
GLOVE SURG SS PI 6.5 STRL IVOR (GLOVE) ×3 IMPLANT
GLOVE SURG SS PI 7.0 STRL IVOR (GLOVE) ×1 IMPLANT
GLOVE SURG SS PI 7.5 STRL IVOR (GLOVE) ×1 IMPLANT
GLOVE SURG SS PI 8.0 STRL IVOR (GLOVE) ×1 IMPLANT
GOWN BRE IMP SLV AUR LG STRL (GOWN DISPOSABLE) ×4 IMPLANT
GOWN BRE IMP SLV AUR XL STRL (GOWN DISPOSABLE) ×1 IMPLANT
GOWN STRL REIN 2XL LVL4 (GOWN DISPOSABLE) IMPLANT
HEMOSTAT POWDER SURGIFOAM 1G (HEMOSTASIS) ×1 IMPLANT
KIT BASIN OR (CUSTOM PROCEDURE TRAY) ×2 IMPLANT
KIT ROOM TURNOVER OR (KITS) ×2 IMPLANT
NDL SPNL 22GX3.5 QUINCKE BK (NEEDLE) ×1 IMPLANT
NEEDLE SPNL 22GX3.5 QUINCKE BK (NEEDLE) ×6 IMPLANT
NS IRRIG 1000ML POUR BTL (IV SOLUTION) ×2 IMPLANT
PACK LAMINECTOMY NEURO (CUSTOM PROCEDURE TRAY) ×2 IMPLANT
PATTIES SURGICAL .5 X1 (DISPOSABLE) ×2 IMPLANT
PLATE ANT CERV XTEND 1 LV 18 (Plate) ×1 IMPLANT
PUTTY DBX 1CC (Putty) ×2 IMPLANT
PUTTY DBX 1CC DEPUY (Putty) IMPLANT
RUBBERBAND STERILE (MISCELLANEOUS) ×4 IMPLANT
SCREW XTD VAR 4.2 SELF TAP 12 (Screw) ×4 IMPLANT
SPACER ADV ACF LORODTIC 8MM (Bone Implant) ×1 IMPLANT
SPONGE GAUZE 4X4 12PLY (GAUZE/BANDAGES/DRESSINGS) ×1 IMPLANT
SPONGE INTESTINAL PEANUT (DISPOSABLE) ×3 IMPLANT
SPONGE SURGIFOAM ABS GEL SZ50 (HEMOSTASIS) ×2 IMPLANT
STRIP CLOSURE SKIN 1/2X4 (GAUZE/BANDAGES/DRESSINGS) ×2 IMPLANT
SUT VIC AB 3-0 SH 8-18 (SUTURE) ×3 IMPLANT
SYR 20ML ECCENTRIC (SYRINGE) ×2 IMPLANT
TAPE HYPAFIX 4 X10 (GAUZE/BANDAGES/DRESSINGS) ×1 IMPLANT
TOWEL OR 17X24 6PK STRL BLUE (TOWEL DISPOSABLE) ×2 IMPLANT
TOWEL OR 17X26 10 PK STRL BLUE (TOWEL DISPOSABLE) ×2 IMPLANT
WATER STERILE IRR 1000ML POUR (IV SOLUTION) ×2 IMPLANT

## 2011-10-18 NOTE — Transfer of Care (Signed)
Immediate Anesthesia Transfer of Care Note  Patient: Gabrielle Castillo  Procedure(s) Performed: Procedure(s) (LRB): ANTERIOR CERVICAL DECOMPRESSION/DISCECTOMY FUSION 1 LEVEL (Bilateral)  Patient Location: PACU  Anesthesia Type: General  Level of Consciousness: awake, alert  and oriented  Airway & Oxygen Therapy: Patient Spontanous Breathing and Patient connected to face mask oxygen  Post-op Assessment: Report given to PACU RN, Post -op Vital signs reviewed and stable, Patient moving all extremities X 4 and Patient able to stick tongue midline  Post vital signs: Reviewed and stable  Complications: No apparent anesthesia complications

## 2011-10-18 NOTE — Progress Notes (Signed)
Pt ordered ancef post-op but received vanc pre-op because the patient has a reported allergy to pcn = anaphylaxis.  Spoke with Dr Newell Coral and received orders to d/c ancef.  No other antibiotics ordered at this time.  Akari Defelice L. Illene Bolus, PharmD, BCPS Clinical Pharmacist Pager: [phone] 10/18/2011 6:18 PM

## 2011-10-18 NOTE — Progress Notes (Signed)
Anterior cervical decompression at c4 5.

## 2011-10-18 NOTE — H&P (Signed)
Gabrielle Castillo is an 56 y.o. female.   Chief Complaint: unable to walk HPI: patient was doing fairly well till November 20012 when she had the flu and suddenly developed weakness allover. She c/o of legs being jumping specially at night. Mri positive for changes in the cord  Past Medical History  Diagnosis Date  . COPD (chronic obstructive pulmonary disease)   . Hypertension   . Thyroid disease   . Emphysema   . Asthma   . Degenerative joint disease   . Shortness of breath   . Sleep apnea     doesn't use CPAP, last sleep study 5+ years, at [place]  . Hypothyroidism   . H/O hiatal hernia   . GERD (gastroesophageal reflux disease)   . Headache   . Depression   . Anxiety     Past Surgical History  Procedure Date  . Abdominal hysterectomy   . Cholecystectomy   . Cardiac catheterization     three times  . Cardiac catheterization   . Cardiovascular stress test     3 + years ago  . Knee arthroscopy     L knee    History reviewed. No pertinent family history. Social History:  reports that she has been smoking.  She does not have any smokeless tobacco history on file. She reports that she does not drink alcohol or use illicit drugs.  Allergies:  Allergies  Allergen Reactions  . Penicillins Anaphylaxis  . Iohexol      Desc: 06-04-05 PT. PREMEDICATED WITH CARDIOLOGY PREMED, BENADRYL, PEPSID, PREDNISONE, LW   . Morphine And Related Nausea And Vomiting    Medications Prior to Admission  Medication Dose Route Frequency Provider Last Rate Last Dose  . mupirocin ointment (BACTROBAN) 2 %           . vancomycin (VANCOCIN) 1,500 mg in sodium chloride 0.9 % 500 mL IVPB  1,500 mg Intravenous Once Karn Cassis, MD      . DISCONTD: ceFAZolin (ANCEF) IVPB 2 g/50 mL premix  2 g Intravenous 60 min Pre-Op Herby Abraham, PHARMD       Medications Prior to Admission  Medication Sig Dispense Refill  . amLODipine-olmesartan (AZOR) 10-40 MG per tablet Take 1 tablet by mouth daily.         Marland Kitchen buPROPion (WELLBUTRIN XL) 150 MG 24 hr tablet Take 150 mg by mouth daily.       . fluticasone-salmeterol (ADVAIR HFA) 115-21 MCG/ACT inhaler Inhale 2 puffs into the lungs 2 (two) times daily.       . furosemide (LASIX) 40 MG tablet Take 40 mg by mouth daily.        Marland Kitchen levothyroxine (SYNTHROID, LEVOTHROID) 300 MCG tablet Take 300 mcg by mouth daily.       Marland Kitchen LORazepam (ATIVAN) 0.5 MG tablet Take 0.5 mg by mouth every 8 (eight) hours.        . methocarbamol (ROBAXIN) 750 MG tablet Take 750 mg by mouth 3 (three) times daily as needed.        . nabumetone (RELAFEN) 750 MG tablet Take 1,500 mg by mouth daily.        Marland Kitchen omeprazole (PRILOSEC) 20 MG capsule Take 20 mg by mouth daily.        Marland Kitchen oxyCODONE (OXY IR/ROXICODONE) 5 MG immediate release tablet Take 5 mg by mouth once. For pain      . oxyCODONE (ROXICODONE) 15 MG immediate release tablet Take 15 mg by mouth every 6 (six) hours  as needed. For pain      . psyllium (FIBER LAXATIVE) 0.52 G capsule Take 0.52 g by mouth daily.        Marland Kitchen tiotropium (SPIRIVA) 18 MCG inhalation capsule Place 18 mcg into inhaler and inhale daily.        Marland Kitchen tolterodine (DETROL LA) 4 MG 24 hr capsule Take 4 mg by mouth daily.          Results for orders placed during the hospital encounter of 10/18/11 (from the past 48 hour(s))  BASIC METABOLIC PANEL     Status: Abnormal   Collection Time   10/18/11 10:14 AM      Component Value Range Comment   Sodium 141  135 - 145 (mEq/L)    Potassium 3.4 (*) 3.5 - 5.1 (mEq/L)    Chloride 103  96 - 112 (mEq/L)    CO2 26  19 - 32 (mEq/L)    Glucose, Bld 106 (*) 70 - 99 (mg/dL)    BUN 12  6 - 23 (mg/dL)    Creatinine, Ser 4.09  0.50 - 1.10 (mg/dL)    Calcium 9.4  8.4 - 10.5 (mg/dL)    GFR calc non Af Amer 80 (*) >90 (mL/min)    GFR calc Af Amer >90  >90 (mL/min)   CBC     Status: Abnormal   Collection Time   10/18/11 10:14 AM      Component Value Range Comment   WBC 11.2 (*) 4.0 - 10.5 (K/uL)    RBC 4.76  3.87 - 5.11 (MIL/uL)      Hemoglobin 14.7  12.0 - 15.0 (g/dL)    HCT 81.1  91.4 - 78.2 (%)    MCV 87.4  78.0 - 100.0 (fL)    MCH 30.9  26.0 - 34.0 (pg)    MCHC 35.3  30.0 - 36.0 (g/dL)    RDW 95.6  21.3 - 08.6 (%)    Platelets 288  150 - 400 (K/uL)    Dg Chest 2 View  10/18/2011  *RADIOLOGY REPORT*  Clinical Data: ACDF, preop.  History of COPD, asthma, shortness of breath.  CHEST - 2 VIEW  Comparison: 12/13/2010  Findings: Slight peribronchial thickening. Heart and mediastinal contours are within normal limits.  No focal opacities or effusions.  No acute bony abnormality.  Slight hyperinflation.  IMPRESSION: Slight hyperinflation and chronic bronchitic changes.  Original Report Authenticated By: Cyndie Chime, M.D.    Review of Systems  HENT: Positive for neck pain.   Respiratory: Positive for wheezing.   Gastrointestinal: Negative.   Genitourinary: Negative.   Musculoskeletal: Positive for back pain.  Skin: Negative.   Neurological: Positive for focal weakness.  Endo/Heme/Allergies: Negative.   Psychiatric/Behavioral: Negative.     Blood pressure 98/68, pulse 96, temperature 97.7 F (36.5 C), temperature source Oral, resp. rate 20, height 5\' 7"  (1.702 m), weight 167.831 kg (370 lb), SpO2 96.00%. Physical Exam patient came to my office in a wheel chair. Hent, nl. Neck,some pain with movement. Cv, nl. Lungs, bilateral wheezing. Abdomen unable to feel a mass. Extremities, grade 1 edema.NEURO  Weakness of deltoids, biceps, triceps. Weakness in both legs right worse than left. dtr increase of dtr with babinski in right. Sensory normal but she feels numb all over. 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  Assessment/Plan Patient to have decompression at c4 5 with fusion. The risks were fully explained to her in my office  Allyson Tineo M 10/18/2011, 12:15  PM

## 2011-10-18 NOTE — Anesthesia Postprocedure Evaluation (Signed)
  Anesthesia Post-op Note  Patient: Gabrielle Castillo  Procedure(s) Performed: Procedure(s) (LRB): ANTERIOR CERVICAL DECOMPRESSION/DISCECTOMY FUSION 1 LEVEL (Bilateral)  Patient Location: PACU  Anesthesia Type: General  Level of Consciousness: awake, alert , oriented and patient cooperative  Airway and Oxygen Therapy: Patient Spontanous Breathing and Patient connected to nasal cannula oxygen  Post-op Pain: mild  Post-op Assessment: Post-op Vital signs reviewed, Patient's Cardiovascular Status Stable, Respiratory Function Stable, Patent Airway, No signs of Nausea or vomiting and Pain level controlled  Post-op Vital Signs: stable  Complications: No apparent anesthesia complications

## 2011-10-18 NOTE — Anesthesia Preprocedure Evaluation (Signed)
Anesthesia Evaluation  Patient identified by MRN, date of birth, ID band Patient awake    Reviewed: Allergy & Precautions, H&P , NPO status , Patient's Chart, lab work & pertinent test results  Airway Mallampati: II TM Distance: >3 FB Neck ROM: full    Dental   Pulmonary shortness of breath, asthma , sleep apnea , COPD COPD inhaler, Current Smoker,  + rhonchi        Cardiovascular hypertension, regular Normal    Neuro/Psych  Headaches, PSYCHIATRIC DISORDERS    GI/Hepatic hiatal hernia, GERD-  ,  Endo/Other  Morbid obesity  Renal/GU      Musculoskeletal   Abdominal   Peds  Hematology   Anesthesia Other Findings   Reproductive/Obstetrics                           Anesthesia Physical Anesthesia Plan  ASA: III  Anesthesia Plan: General ETT   Post-op Pain Management:    Induction: Intravenous  Airway Management Planned: Oral ETT  Additional Equipment:   Intra-op Plan:   Post-operative Plan: Extubation in OR  Informed Consent: I have reviewed the patients History and Physical, chart, labs and discussed the procedure including the risks, benefits and alternatives for the proposed anesthesia with the patient or authorized representative who has indicated his/her understanding and acceptance.     Plan Discussed with: Anesthesiologist, CRNA and Surgeon  Anesthesia Plan Comments:         Anesthesia Quick Evaluation

## 2011-10-18 NOTE — Progress Notes (Signed)
operrative report number [phone]

## 2011-10-18 NOTE — Preoperative (Signed)
Beta Blockers   Reason not to administer Beta Blockers:Not Applicable 

## 2011-10-18 NOTE — Anesthesia Procedure Notes (Signed)
Procedure Name: Intubation Date/Time: 10/18/2011 1:19 PM Performed by: Julianne Rice K Pre-anesthesia Checklist: Emergency Drugs available, Patient identified, Timeout performed, Suction available and Patient being monitored Patient Re-evaluated:Patient Re-evaluated prior to inductionOxygen Delivery Method: Circle system utilized Preoxygenation: Pre-oxygenation with 100% oxygen Intubation Type: IV induction Ventilation: Mask ventilation without difficulty Laryngoscope Size: Mac and 3 Grade View: Grade II Tube type: Oral Tube size: 7.5 mm Number of attempts: 1 Airway Equipment and Method: Patient positioned with wedge pillow and LTA kit utilized Placement Confirmation: ETT inserted through vocal cords under direct vision,  breath sounds checked- equal and bilateral and positive ETCO2 Secured at: 23 cm Tube secured with: Tape Dental Injury: Teeth and Oropharynx as per pre-operative assessment

## 2011-10-19 ENCOUNTER — Encounter (HOSPITAL_COMMUNITY): Payer: Self-pay | Admitting: Physical Medicine and Rehabilitation

## 2011-10-19 DIAGNOSIS — M4712 Other spondylosis with myelopathy, cervical region: Secondary | ICD-10-CM

## 2011-10-19 NOTE — Progress Notes (Signed)
UR COMPLETED  

## 2011-10-19 NOTE — Progress Notes (Signed)
Patient ID: Gabrielle Castillo, female   DOB: 02/12/1956, 56 y.o.   MRN: [id number] Minimal drainage. Sensory getting better. Able to walk on her own. Wound dry. Waiting for rehab consult.

## 2011-10-19 NOTE — Progress Notes (Signed)
OT Cancellation Note  Treatment cancelled today due to Pt. just back to bed and fatigued.  Will re-attempt tomorrow.  Jeani Hawking M [phone] 10/19/2011, 2:44 PM

## 2011-10-19 NOTE — Progress Notes (Signed)
Physical Therapy Evaluation Patient Details Name: Gabrielle Castillo MRN: [id number] DOB: 1956/05/16 Today's Date: 10/19/2011  Problem List: There is no problem list on file for this patient.   Past Medical History:  Past Medical History  Diagnosis Date  . COPD (chronic obstructive pulmonary disease)   . Hypertension   . Thyroid disease   . Emphysema   . Asthma   . Degenerative joint disease   . Shortness of breath   . Sleep apnea     doesn't use CPAP, last sleep study 5+ years, at [place]  . Hypothyroidism   . H/O hiatal hernia   . GERD (gastroesophageal reflux disease)   . Headache   . Depression   . Anxiety    Past Surgical History:  Past Surgical History  Procedure Date  . Abdominal hysterectomy   . Cholecystectomy   . Cardiac catheterization     three times  . Cardiac catheterization   . Cardiovascular stress test     3 + years ago  . Knee arthroscopy     L knee    PT Assessment/Plan/Recommendation PT Assessment Clinical Impression Statement: Pt presents with a medical diagnosis of ACDF C4-5 with LE weakness from nerve compression prior to surgery. Pt will benefit from skilled PT in the acute care setting in order to maximize functional mobility for a safe d/c home PT Recommendation/Assessment: Patient will need skilled PT in the acute care venue PT Problem List: Decreased strength;Decreased activity tolerance;Decreased mobility;Decreased knowledge of use of DME;Decreased knowledge of precautions;Pain PT Therapy Diagnosis : Difficulty walking;Acute pain PT Plan PT Frequency: Min 5X/week PT Treatment/Interventions: DME instruction;Gait training;Functional mobility training;Therapeutic activities;Therapeutic exercise;Patient/family education PT Recommendation Follow Up Recommendations: Home health PT;Supervision - Intermittent Equipment Recommended: None recommended by PT PT Goals  Acute Rehab PT Goals PT Goal Formulation: With patient Time For Goal  Achievement: 7 days Pt will go Supine/Side to Sit: with modified independence PT Goal: Supine/Side to Sit - Progress: Goal set today Pt will go Sit to Supine/Side: with modified independence PT Goal: Sit to Supine/Side - Progress: Goal set today Pt will go Sit to Stand: with supervision PT Goal: Sit to Stand - Progress: Goal set today Pt will go Stand to Sit: with supervision PT Goal: Stand to Sit - Progress: Goal set today Pt will Transfer Bed to Chair/Chair to Bed: with supervision PT Transfer Goal: Bed to Chair/Chair to Bed - Progress: Goal set today Pt will Perform Home Exercise Program: Independently PT Goal: Perform Home Exercise Program - Progress: Goal set today  PT Evaluation Precautions/Restrictions  Precautions Precautions: Other (comment) (cervical) Required Braces or Orthoses: No Prior Functioning  Home Living Lives With: Daughter;Family Receives Help From: Family Type of Home: House Home Layout: One level Home Access: Ramped entrance Bathroom Shower/Tub: Tub/shower unit;Curtain Bathroom Toilet: Handicapped height Bathroom Accessibility: Yes How Accessible: Accessible via walker Home Adaptive Equipment: Walker - standard;Wheelchair - manual;Tub transfer bench;Straight cane;Grab bars in shower;Hand-held shower hose;Bedside commode/3-in-1 Prior Function Level of Independence: Needs assistance with ADLs;Independent with gait;Independent with transfers (ambulates with RW) Bath: Moderate Toileting: Supervision/set-up Dressing: Supervision/set-up Able to Take Stairs?: No Driving: No Vocation: On disability Leisure: Hobbies-yes (Comment) Comments: walking with sisters Cognition Cognition Arousal/Alertness: Awake/alert Overall Cognitive Status: Appears within functional limits for tasks assessed Orientation Level: Oriented X4 Sensation/Coordination Sensation Light Touch: Appears Intact Extremity Assessment RLE Assessment RLE Assessment: Exceptions to [facility] RLE  Strength RLE Overall Strength: Deficits;Due to premorbid status RLE Overall Strength Comments: MMT Overall >/= 4/5 LLE Assessment LLE  Assessment: Exceptions to [facility] LLE Strength LLE Overall Strength: Deficits;Due to premorbid status LLE Overall Strength Comments: MMT Overall >/= 4/5 Mobility (including Balance) Bed Mobility Bed Mobility: No (pt sitting in chair upon arrival) Transfers Transfers: Yes Sit to Stand: 4: Min assist;With upper extremity assist;From chair/3-in-1;From toilet Sit to Stand Details (indicate cue type and reason): VC for hand placement for safety. Min assist for stability Stand to Sit: 5: Supervision;With upper extremity assist;To chair/3-in-1;To toilet Stand to Sit Details: VC for hand placement and anterior translation into standing Ambulation/Gait Ambulation/Gait: Yes Ambulation/Gait Assistance: Other (comment) (Minguard assist) Ambulation Distance (Feet): 75 Feet Assistive device: Rolling walker Gait Pattern: Step-to pattern;Decreased hip/knee flexion - right;Decreased hip/knee flexion - left;Trunk flexed Gait velocity: Postural cues throughout as well as safety with distance to RW. Cueing required to maintain cervical precautions when turning Stairs: No    Exercise    End of Session PT - End of Session Activity Tolerance: Patient tolerated treatment well Patient left: in chair;with call bell in reach Nurse Communication: Mobility status for transfers;Mobility status for ambulation General Behavior During Session: [facility] for tasks performed Cognition: [facility] for tasks performed  Milana Kidney 10/19/2011, 10:44 AM  10/19/2011 Milana Kidney DPT PAGER: [phone] OFFICE: [phone]

## 2011-10-19 NOTE — Op Note (Signed)
[name], Gabrielle Castillo NO.:  [id number]  MEDICAL RECORD NO.:  [id number]  LOCATION:  3037                         FACILITY:  [facility]  PHYSICIAN:  Hilda Lias, M.D.   DATE OF BIRTH:  [birth year]  DATE OF PROCEDURE: DATE OF DISCHARGE:                              OPERATIVE REPORT   PREOPERATIVE DIAGNOSES: 1. 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 2. Morbid obesity.  POSTOPERATIVE DIAGNOSES: 1. 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 2. Morbid obesity.  PROCEDURE:  Anterior 4-5 diskectomy, decompression of the spinal cord, interbody fusion with graft, plates, and microscope.  SURGEON:  Hilda Lias, MD  ASSISTANT:  Kathaleen Maser. Pool, MD  CLINICAL HISTORY:  [name] is a 56 year old lady, over 380 pounds, who had had the flu in November 2012.  Since then, she has been unable to walk.  She came to see me in my office last week with a_wheel chair and the mri showed a hnp_________ at the level of 4-5, severe spinal stenosis.  Surgery was advised.  The patient knew the risk of the surgery including the possibility of no improvement whatsoever.   PROCEDURE:  The patient was taken to the OR and after intubation, traction was applied to the leg.  The left side of the neck was cleaned with DuraPrep.  A transverse incision in the upper cervical area was Done  __________ through the skin, subcutaneous tissue, platysma down to the cervical spine.  We did some x-rays and we were unable to ____to______ localize C4-5.  We brought the microscope into the area. __area.discectomy at c45 was done and we open ________ the posterior ligaments hich showed hnp_centrally and with extension_________ bilaterally. Decompression of the spinal cord as well as both C5 nerve roots Was achieved.  Then, the endplates were drilled, and an allograft _with autograft  Inside_was inserted________ followed by the plate using 4 screws.  Repeat x-ray showed only the  tip of screws at the level ofc4.  Although, we  achieved good hemostasis, we left a drain.  Then, the wound was closed with Vicryl and a Steri- Strip.          ______________________________ Hilda Lias, M.D.    EB/MEDQ  D:  10/18/2011  T:  10/19/2011  Job:  [id number]

## 2011-10-19 NOTE — Progress Notes (Signed)
Met with pt re d/c needs, pt states that she is currently receiving hh services from [facility]. HHRN, HHPT and HH aide. Will ask [facility] to continue [facility] services. Pt has DME, will continue to follow for progression.  Johny Shock RN MPH Case manager [phone]

## 2011-10-19 NOTE — Consult Note (Signed)
Physical Medicine and Rehabilitation Consult Reason for Consult: Cervical myelopathy. Referring Phsyician:  Dr. Jeral Fruit.    HPI: Gabrielle Castillo is an 56 y.o. 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 Patient elected to undergo C4-5 decompression with fusion by Dr. Jeral Fruit on 2/28.   PT evaluations today and patient ambulated with min/guard assist.  The patient states she can now write with her right hand. She can also feel her fingers and toes better area and she ambulated with a walker about 70 feet with physical therapy.  Review of Systems  Eyes: Negative for blurred vision and double vision.  Respiratory: Negative for sputum production and shortness of breath.   Cardiovascular: Negative for palpitations.  Musculoskeletal: Positive for myalgias and joint pain.  All other systems reviewed and are negative.   Past Medical History  Diagnosis Date  . COPD (chronic obstructive pulmonary disease)   . Hypertension   . Thyroid disease   . Emphysema   . Asthma   . Degenerative joint disease   . Shortness of breath   . Sleep apnea     doesn't use CPAP, last sleep study 5+ years, at [place]  . Hypothyroidism   . H/O hiatal hernia   . GERD (gastroesophageal reflux disease)   . Headache   . Depression   . Anxiety    Past Surgical History  Procedure Date  . Abdominal hysterectomy   . Cholecystectomy   . Cardiac catheterization     three times  . Cardiac catheterization   . Cardiovascular stress test     3 + years ago  . Knee arthroscopy     L knee   Family History  Problem Relation Age of Onset  . Diabetes Mother   . Hypertension Mother   . Seizures Father     Social History: lives with family.  She smokes 1 PPD.  She does not have any smokeless tobacco history on file. She reports that she does not drink alcohol or use illicit drugs. Daughter at home can provide supervision past discharge.     Allergies  Allergen Reactions  . Ivp Dye (Iodinated Diagnostic Agents) Anaphylaxis  . Penicillins Anaphylaxis  . Sulfa Antibiotics Other (See Comments)    Makes it worse  . Iohexol      Desc: 06-04-05 PT. PREMEDICATED WITH CARDIOLOGY PREMED, BENADRYL, PEPSID, PREDNISONE, LW   . Morphine And Related Nausea And Vomiting  . Latex Rash    Prior to Admission medications   Medication Sig Start Date End Date Taking? Authorizing Provider  amLODipine-olmesartan (AZOR) 10-40 MG per tablet Take 1 tablet by mouth daily.     Yes Historical Provider, MD  buPROPion (WELLBUTRIN XL) 150 MG 24 hr tablet Take 150 mg by mouth daily.    Yes Historical Provider, MD  fluticasone-salmeterol (ADVAIR HFA) 115-21 MCG/ACT inhaler Inhale 2 puffs into the lungs 2 (two) times daily.    Yes Historical Provider, MD  furosemide (LASIX) 40 MG tablet Take 40 mg by mouth daily.     Yes Historical Provider, MD  levothyroxine (SYNTHROID, LEVOTHROID) 300 MCG tablet Take 300 mcg by mouth daily.    Yes Historical Provider, MD  LORazepam (ATIVAN) 0.5 MG tablet Take 0.5 mg by mouth every 8 (eight) hours.     Yes Historical Provider, MD  methocarbamol (ROBAXIN) 750 MG tablet Take 750 mg by mouth 3 (three) times daily as needed.     Yes Historical  Provider, MD  nabumetone (RELAFEN) 750 MG tablet Take 1,500 mg by mouth daily.     Yes Historical Provider, MD  omeprazole (PRILOSEC) 20 MG capsule Take 20 mg by mouth daily.     Yes Historical Provider, MD  oxyCODONE (OXY IR/ROXICODONE) 5 MG immediate release tablet Take 5 mg by mouth once. For pain   Yes Historical Provider, MD  oxyCODONE (ROXICODONE) 15 MG immediate release tablet Take 15 mg by mouth every 6 (six) hours as needed. For pain   Yes Historical Provider, MD  psyllium (FIBER LAXATIVE) 0.52 G capsule Take 0.52 g by mouth daily.     Yes Historical Provider, MD  tiotropium (SPIRIVA) 18 MCG inhalation capsule Place 18 mcg into inhaler and inhale daily.     Yes Historical  Provider, MD  tolterodine (DETROL LA) 4 MG 24 hr capsule Take 4 mg by mouth daily.     Yes Historical Provider, MD   Scheduled Medications:    . amLODipine  10 mg Oral Daily  . buPROPion  150 mg Oral Daily  . dexamethasone  4 mg Intravenous Q6H   Or  . dexamethasone  4 mg Oral Q6H  . docusate sodium  100 mg Oral BID  . furosemide  40 mg Oral Daily  . HYDROmorphone      . HYDROmorphone      . irbesartan  300 mg Oral Daily  . levothyroxine  300 mcg Oral Daily  . pantoprazole  40 mg Oral Q1200  . sodium chloride  3 mL Intravenous Q12H  . tiotropium  18 mcg Inhalation Daily  . tolterodine  4 mg Oral Daily  . DISCONTD: amLODipine-olmesartan  1 tablet Oral Daily  . DISCONTD:  ceFAZolin (ANCEF) IV  1 g Intravenous Q8H   PRN MED's: acetaminophen, acetaminophen, diazepam, menthol-cetylpyridinium, morphine, ondansetron (ZOFRAN) IV, oxyCODONE-acetaminophen, phenol, sodium chloride, zolpidem, DISCONTD: 0.9 % irrigation (POUR BTL), DISCONTD: hemostatic agents, DISCONTD: HYDROmorphone, DISCONTD: HYDROmorphone, DISCONTD: ondansetron (ZOFRAN) IV, DISCONTD: ondansetron (ZOFRAN) IV, DISCONTD: Surgifoam 1 Gm with Thrombin 5,000 units (5 ml) topical solution, DISCONTD: thrombin Home: Home Living Lives With: Daughter;Family Receives Help From: Family Type of Home: House Home Layout: One level Home Access: Ramped entrance Bathroom Shower/Tub: Tub/shower unit;Curtain Bathroom Toilet: Handicapped height Bathroom Accessibility: Yes How Accessible: Accessible via walker Home Adaptive Equipment: Walker - standard;Wheelchair - manual;Tub transfer bench;Straight cane;Grab bars in shower;Hand-held shower hose;Bedside commode/3-in-1  Functional History: Prior Function Level of Independence: Needs assistance with ADLs;Independent with gait;Independent with transfers (ambulates with RW) Bath: Moderate Toileting: Supervision/set-up Dressing: Supervision/set-up Able to Take Stairs?: No Driving:  No Vocation: On disability Leisure: Hobbies-yes (Comment) Comments: walking with sisters Functional Status:  Mobility: Bed Mobility Bed Mobility: No (pt sitting in chair upon arrival) Transfers Transfers: Yes Sit to Stand: 4: Min assist;With upper extremity assist;From chair/3-in-1;From toilet Sit to Stand Details (indicate cue type and reason): VC for hand placement for safety. Min assist for stability Stand to Sit: 5: Supervision;With upper extremity assist;To chair/3-in-1;To toilet Stand to Sit Details: VC for hand placement and anterior translation into standing Ambulation/Gait Ambulation/Gait: Yes Ambulation/Gait Assistance: Other (comment) (Minguard assist) Ambulation Distance (Feet): 75 Feet Assistive device: Rolling walker Gait Pattern: Step-to pattern;Decreased hip/knee flexion - right;Decreased hip/knee flexion - left;Trunk flexed Gait velocity: Postural cues throughout as well as safety with distance to RW. Cueing required to maintain cervical precautions when turning Stairs: No    ADL:    Cognition: Cognition Arousal/Alertness: Awake/alert Orientation Level: Oriented X4 Cognition Arousal/Alertness: Awake/alert Overall Cognitive Status: Appears within  functional limits for tasks assessed Orientation Level: Oriented X4  Blood pressure 111/68, pulse 89, temperature 98 F (36.7 C), temperature source Oral, resp. rate 18, height 5\' 7"  (1.702 m), weight 167.831 kg (370 lb), SpO2 94.00%.  Physical Exam  Constitutional: She is oriented to person, place, and time.       Morbidly obese  HENT:  Head: Normocephalic and atraumatic.  Eyes: Conjunctivae are normal. Pupils are equal, round, and reactive to light.  Neck:       Dry dressing left neck.  Cardiovascular: Normal rate and regular rhythm.   Pulmonary/Chest: Effort normal and breath sounds normal.  Abdominal: Soft. Bowel sounds are normal.  Neurological: She is alert and oriented to person, place, and time.   Skin: Skin is warm and dry.  Psychiatric: She has a normal mood and affect. Her behavior is normal. Thought content normal.   sensory intact to light touch in bilateral upper and lower extremities. She does complain of paresthesias. Motor exam 4/5 in bilateral deltoid, biceps, triceps, grip 3 minus in the right hip flexor 4 minus in the right knee extensor 3 minus in the right ankle dorsiflexor and plantar flexor 4 throughout and the left lower extremity. Tone is normal Extremities without clubbing cyanosis or edema Upper extremity active range of motion is normal Mood and affect are appropriate  No results found for this or any previous visit (from the past 24 hour(s)). Dg Chest 2 View  10/18/2011  *RADIOLOGY REPORT*  Clinical Data: ACDF, preop.  History of COPD, asthma, shortness of breath.  CHEST - 2 VIEW  Comparison: 12/13/2010  Findings: Slight peribronchial thickening. Heart and mediastinal contours are within normal limits.  No focal opacities or effusions.  No acute bony abnormality.  Slight hyperinflation.  IMPRESSION: Slight hyperinflation and chronic bronchitic changes.  Original Report Authenticated By: Cyndie Chime, M.D.   Dg Cervical Spine Complete  10/18/2011  *RADIOLOGY REPORT*  Clinical Data: C4-5 ACDF.  CERVICAL SPINE - COMPLETE 4+ VIEW  Comparison: 10/09/2011  Findings: Image labeled #1 is severely limited due to overlying shoulders.  Cannot visualize below the C2 level.  Localizing instruments likely at approximately the C3-4 and C4-5 levels.  Film labeled #2 demonstrates anterior localizing instrument at but likely C2-3 and C3-4.  Again, cannot visualize below the C2 level due to overlying shoulders.  Film labeled #3 demonstrates localizing instruments at C2-3 and C3- 4.  Film labeled #4 demonstrates the superior aspect of an anterior plate at the C4 level.  Cannot visualize the lobe the superior aspect of C4 due to overlying shoulders.  IMPRESSION: Reported ACDF at C4-5.  The  superior aspect of the plate can be visualized as C4.  Cannot visualize below this level due to overlying shoulders.  Original Report Authenticated By: Cyndie Chime, M.D.    Assessment/Plan: Diagnosis: Cervical spondylosis and myelopathy causing primarily right lower extremity weakness. 1. Does the need for close, 24 hr/day medical supervision in concert with the patient's rehab needs make it unreasonable for this patient to be served in a less intensive setting? No 2. Co-Morbidities requiring supervision/potential complications: Morbid obesity, hypertension 3. Due to safety, does the patient require 24 hr/day rehab nursing? No 4. Does the patient require coordinated care of a physician, rehab nurse, PT (NA hrs/day, NA days/week) and OT (NA hrs/day, NA days/week) to address physical and functional deficits in the context of the above medical diagnosis(es)? No Addressing deficits in the following areas: NA 5. Can the patient actively participate  in an intensive therapy program of at least 3 hrs of therapy per day at least 5 days per week? Yes 6. The potential for patient to make measurable gains while on inpatient rehab is NA 7. Anticipated functional outcomes upon discharge from inpatients are supervision to mod PT, supervision to modified independent OT, not applicable SLP 8. Estimated rehab length of stay to reach the above functional goals is: Not applicable 9. Does the patient have adequate social supports to accommodate these discharge functional goals? Yes 10. Anticipated D/C setting: Home 11. Anticipated post D/C treatments: HH therapy 12. Overall Rehab/Functional Prognosis: excellent  RECOMMENDATIONS: This patient's condition is appropriate for continued rehabilitative care in the following setting: Home with home health Patient has agreed to participate in recommended program. Yes Note that insurance prior authorization may be required for reimbursement for recommended  care.  Comment: Patient is already at a high functional status postoperative day 1 expect quick recovery despite her morbid obesity.   Jacquelynn Cree 10/19/2011

## 2011-10-19 NOTE — Progress Notes (Signed)
Physical medicine and rehabilitation consulted after anterior cervical decompression. Patient just moved from PACU unit. Will await physical and occupational therapy evaluation and follow up with formal rehabilitation consult

## 2011-10-20 NOTE — Evaluation (Signed)
Occupational Therapy Evaluation Patient Details Name: Gabrielle Castillo MRN: [id number] DOB: 1955-10-10 Today's Date: 10/20/2011  Problem List: There is no problem list on file for this patient.   Past Medical History:  Past Medical History  Diagnosis Date  . COPD (chronic obstructive pulmonary disease)   . Hypertension   . Thyroid disease   . Emphysema   . Asthma   . Degenerative joint disease   . Shortness of breath   . Sleep apnea     doesn't use CPAP, last sleep study 5+ years, at [place]  . Hypothyroidism   . H/O hiatal hernia   . GERD (gastroesophageal reflux disease)   . Headache   . Depression   . Anxiety    Past Surgical History:  Past Surgical History  Procedure Date  . Abdominal hysterectomy   . Cholecystectomy   . Cardiac catheterization     three times  . Cardiac catheterization   . Cardiovascular stress test     3 + years ago  . Knee arthroscopy     L knee    OT Assessment/Plan/Recommendation OT Assessment Clinical Impression Statement: This 56 y.o. female admitted for cervical fusion.  Pt. presents to OT with below listed deficits.   All education has been completed, and pt. has been provided with AE for LB ADLs.  She will have adequate assistance at home to allow her to return home with family.  Recommend continued HHOT to address UE deficts, and IADLs OT Recommendation/Assessment: All further OT needs can be met in the next venue of care OT Problem List: Decreased strength;Decreased activity tolerance;Decreased coordination;Impaired sensation;Obesity;Impaired UE functional use;Pain OT Therapy Diagnosis : Generalized weakness;Acute pain OT Recommendation Follow Up Recommendations: Home health OT Equipment Recommended: None recommended by OT Individuals Consulted Consulted and Agree with Results and Recommendations: Patient OT Goals    OT Evaluation Precautions/Restrictions  Precautions Precautions: Other (comment) (Cervical) Required Braces or  Orthoses: No Restrictions Weight Bearing Restrictions: No Prior Functioning Home Living Lives With: Daughter;Family Receives Help From: Family Type of Home: House Home Layout: One level Home Access: Ramped entrance Bathroom Shower/Tub: Tub/shower unit;Curtain Bathroom Toilet: Handicapped height Bathroom Accessibility: Yes How Accessible: Accessible via walker Home Adaptive Equipment: Walker - standard;Wheelchair - manual;Tub transfer bench;Straight cane;Grab bars in shower;Hand-held shower hose;Bedside commode/3-in-1 Prior Function Level of Independence: Needs assistance with ADLs;Independent with gait;Independent with transfers Bath: Moderate Toileting: Supervision/set-up Dressing: Supervision/set-up Able to Take Stairs?: No Driving: No Vocation: On disability ADL ADL Eating/Feeding: Simulated;Modified independent (Due to impaired sensation) Where Assessed - Eating/Feeding: Chair Grooming: Performed;Wash/dry hands;Denture care;Supervision/safety Where Assessed - Grooming: Standing at sink Upper Body Bathing: Simulated;Minimal assistance (due to body habitus) Where Assessed - Upper Body Bathing: Unsupported;Sitting, chair Lower Body Bathing: Simulated;Moderate assistance Where Assessed - Lower Body Bathing: Sit to stand from chair Upper Body Dressing: Simulated;Moderate assistance (due to body habitus) Where Assessed - Upper Body Dressing: Unsupported;Sitting, chair Lower Body Dressing: Simulated;Moderate assistance Where Assessed - Lower Body Dressing: Sit to stand from chair Toilet Transfer: Simulated;Supervision/safety Toilet Transfer Method: Proofreader: Comfort height toilet;Grab bars Toileting - Clothing Manipulation: Simulated;Minimal assistance Where Assessed - Glass blower/designer Manipulation: Standing Toileting - Hygiene: Simulated;Minimal assistance (due to body habitus) Where Assessed - Toileting Hygiene: Standing Equipment Used: Rolling  walker;Sock aid;Long-handled sponge;Reacher Ambulation Related to ADLs: Pt. ambulated in room with supervision ADL Comments: Pt. reports her grandchildren assist her with self care activities as needed, and will continue to do so.  Pt. was instructed in and provided with  sock aid, reacher, and LH sponge for home, use.  Using AE, pt. able to perform LB ADLs at min A- supervision level.  Pt with dyspnea 2-3/4 Vision/Perception    Cognition Cognition Arousal/Alertness: Awake/alert Overall Cognitive Status: Appears within functional limits for tasks assessed Orientation Level: Oriented X4 Sensation/Coordination Sensation Light Touch: Impaired by gross assessment (Pt. reports tingling and decrased sensation bil. hands/arms) Hot/Cold: Impaired by gross assessment Additional Comments: Pt reports sensation impairment began 11/12 Coordination Gross Motor Movements are Fluid and Coordinated: Yes Fine Motor Movements are Fluid and Coordinated: No (Due to impaired sensation) Extremity Assessment RUE Assessment RUE Assessment: Within Functional Limits LUE Assessment LUE Assessment: Within Functional Limits Mobility  Bed Mobility Bed Mobility: No Transfers Transfers: Yes Sit to Stand: 5: Supervision;With upper extremity assist;From chair/3-in-1;With armrests Sit to Stand Details (indicate cue type and reason): cues for hand placement Stand to Sit: 5: Supervision;With upper extremity assist;To chair/3-in-1   End of Session OT - End of Session Activity Tolerance: Patient limited by fatigue Patient left: in chair;with call bell in reach General Behavior During Session: [facility] for tasks performed Cognition: [facility] for tasks performed   Srihari Shellhammer, Ursula Alert M 10/20/2011, 1:10 PM

## 2011-10-20 NOTE — Discharge Summary (Signed)
Physician Discharge Summary  Patient ID: Gabrielle Castillo MRN: [id number] DOB/AGE: 1956/08/15 56 y.o.  Admit date: 10/18/2011 Discharge date: 10/20/2011  Admission Diagnoses:c45 hnp.quadriparesis. Morbid obesity  Discharge Diagnoses: same Active Problems:  * No active hospital problems. *    Discharged Condition:stronger. No more wheel chair as preop.  Hospital Course: fusion at c45  Consults: rehab medicine  Significant Diagnostic Studies: mri  Treatments: surgery  Discharge Exam: Blood pressure 122/66, pulse 98, temperature 98.5 F (36.9 C), temperature source Oral, resp. rate 20, height 5\' 7"  (1.702 m), weight 167.831 kg (370 lb), SpO2 91.00%. Ambulating without wheelchair  Disposition: home   Medication List  As of 10/20/2011  2:45 PM   ASK your doctor about these medications         AZOR 10-40 MG per tablet   Generic drug: amLODipine-olmesartan   Take 1 tablet by mouth daily.      buPROPion 150 MG 24 hr tablet   Commonly known as: WELLBUTRIN XL   Take 150 mg by mouth daily.      FIBER LAXATIVE 0.52 G capsule   Generic drug: psyllium   Take 0.52 g by mouth daily.      fluticasone-salmeterol 115-21 MCG/ACT inhaler   Commonly known as: ADVAIR HFA   Inhale 2 puffs into the lungs 2 (two) times daily.      furosemide 40 MG tablet   Commonly known as: LASIX   Take 40 mg by mouth daily.      levothyroxine 300 MCG tablet   Commonly known as: SYNTHROID, LEVOTHROID   Take 300 mcg by mouth daily.      LORazepam 0.5 MG tablet   Commonly known as: ATIVAN   Take 0.5 mg by mouth every 8 (eight) hours.      methocarbamol 750 MG tablet   Commonly known as: ROBAXIN   Take 750 mg by mouth 3 (three) times daily as needed.      nabumetone 750 MG tablet   Commonly known as: RELAFEN   Take 1,500 mg by mouth daily.      omeprazole 20 MG capsule   Commonly known as: PRILOSEC   Take 20 mg by mouth daily.      oxyCODONE 15 MG immediate release tablet   Commonly known  as: ROXICODONE   Take 15 mg by mouth every 6 (six) hours as needed. For pain      oxyCODONE 5 MG immediate release tablet   Commonly known as: Oxy IR/ROXICODONE   Take 5 mg by mouth once. For pain      tiotropium 18 MCG inhalation capsule   Commonly known as: SPIRIVA   Place 18 mcg into inhaler and inhale daily.      tolterodine 4 MG 24 hr capsule   Commonly known as: DETROL LA   Take 4 mg by mouth daily.             Signed: Karn Cassis 10/20/2011, 2:45 PM

## 2011-10-20 NOTE — Progress Notes (Signed)
Patient ID: Gabrielle Castillo, female   DOB: 1956-06-06, 56 y.o.   MRN: [id number] Doing well,able to walk with me without any assistance. Wound dry. Waiting for nursing home

## 2011-10-20 NOTE — Progress Notes (Signed)
Patient's IV came out. Patient stated she did not want another IV and that she would just take PO pain meds. Will continue to monitor.

## 2011-10-20 NOTE — Progress Notes (Signed)
Patient appropriate for home health. Not in need of inpatient acute rehab at this time. Please call with any questions. Pager [phone]

## 2011-10-20 NOTE — Progress Notes (Signed)
Patient ID: Gabrielle Castillo, female   DOB: [birth year], 56 y.o.   MRN: [id number] Patient is to be discharge in am. rx was given to her. My partner on call should ok her discharge.  Summary dictated.

## 2011-10-20 NOTE — Progress Notes (Signed)
Agree with PTA to add ambulation goal.  Jake Shark, PT DPT Pager:  [phone]

## 2011-10-20 NOTE — Progress Notes (Signed)
CSW received consult for SNF. Currently, PT is recommending home health. RNCM is aware and following. CSW is signing off as dispo is for pt to discharge home with home health services. Please reconsult if a clinical social work needs arises prior to discharge.   Christionna Poland, MSW, LCSWA #[phone] 

## 2011-10-20 NOTE — Progress Notes (Signed)
Physical Therapy Treatment Patient Details Name: Gabrielle Castillo MRN: [id number] DOB: [birth year] Today's Date: 10/20/2011  PT Assessment/Plan  PT - Assessment/Plan Comments on Treatment Session: Pt progressing with PT goals.  Becomes SOB quickly with activity but pt states this is baseline for her.  02 sats 94% RA after ambulation.   PT Plan: Discharge plan remains appropriate PT Frequency: Min 5X/week Follow Up Recommendations: Home health PT;Supervision - Intermittent Equipment Recommended: None recommended by PT PT Goals  Acute Rehab PT Goals PT Goal: Sit to Stand - Progress: Progressing toward goal PT Goal: Stand to Sit - Progress: Progressing toward goal Pt will Ambulate: 51 - 150 feet;with modified independence;with least restrictive assistive device PT Goal: Ambulate - Progress: Goal set today PT Goal: Perform Home Exercise Program - Progress: Progressing toward goal **Ambulation goal added today**  PT Treatment Precautions/Restrictions  Precautions Precautions: Other (comment) (cervical ) Required Braces or Orthoses: No Restrictions Weight Bearing Restrictions: No Mobility (including Balance) Bed Mobility Bed Mobility: No Transfers Sit to Stand: Other (comment);From toilet;From chair/3-in-1;With upper extremity assist;With armrests (Min Guard (A)) Sit to Stand Details (indicate cue type and reason): cues for hand placement Stand to Sit: To toilet;To chair/3-in-1;With upper extremity assist;With armrests;5: Supervision Stand to Sit Details: cues for hand placement.  Ambulation/Gait Ambulation/Gait Assistance: Other (comment) (Min Guard (A)) Ambulation/Gait Assistance Details (indicate cue type and reason): Pt becomes SOB quickly.  Relies heavily on RW due to LE's feeling weak per pt.  cues for pursed lip breathing.  Cues for upright posture.  Ambulation Distance (Feet): 80 Feet Assistive device: Rolling walker Gait Pattern: Step-through pattern;Decreased stride  length;Decreased step length - right;Decreased step length - left;Trunk flexed Stairs: No Wheelchair Mobility Wheelchair Mobility: No  Posture/Postural Control Posture/Postural Control: No significant limitations Exercise  General Exercises - Lower Extremity Long Arc Quad: AROM;Strengthening;Both;Other reps (comment);Seated (12 reps. ) Hip Flexion/Marching: AROM;Strengthening;Both;Other reps (comment);Seated (12 reps. ) Heel Raises: AROM;Strengthening;Both;Other reps (comment);Seated (12 reps) End of Session PT - End of Session Activity Tolerance: Patient tolerated treatment well;Patient limited by fatigue Patient left: in chair;with call bell in reach Nurse Communication: Mobility status for transfers;Mobility status for ambulation General Behavior During Session: [facility] for tasks performed Cognition: [facility] for tasks performed  Lara Mulch 10/20/2011, 11:50 AM [phone]

## 2011-10-20 NOTE — Progress Notes (Signed)
Patient ID: Gabrielle Castillo, female   DOB: 31-May-1956, 56 y.o.   MRN: [id number] Better,stronger. Drain out. Rehab declined to take her because she is doing better. Dc in am. To see me in 3 weeks.

## 2011-10-21 NOTE — Progress Notes (Signed)
Agree with updated D/C plans  Dahlia Client Beverely Pace) Carleene Mains PT, DPT Acute Rehabilitation [phone]

## 2011-10-21 NOTE — Progress Notes (Addendum)
Physical Therapy Treatment Patient Details Name: Gabrielle Castillo MRN: [id number] DOB: [birth year] Today's Date: 10/21/2011  PT Assessment/Plan  PT - Assessment/Plan Comments on Treatment Session: Good progress today with increased gait distance.  PT Plan: Discharge plan remains appropriate;Frequency remains appropriate PT Frequency: Min 5X/week Follow Up Recommendations: Home health PT;Supervision - Intermittent Equipment Recommended: Rolling walker with 5" wheels;3 in 1 bedside commode. Pt has a standard walker at home that she got back in 1997 that is "heavy to lift" and would be better with a RW. Pt is requesting a 3n1 for ease in standing from toilet due to handles.    PT Treatment Precautions/Restrictions  Precautions Precautions: Other (comment) (Cervical precautions) Required Braces or Orthoses: No Restrictions Weight Bearing Restrictions: No  PT Goals Pt progressed towards her transfer and gait goals this session.  Mobility (including Balance) Bed Mobility Bed Mobility: No Transfers Sit to Stand: 5: Supervision;From bed;With upper extremity assist Sit to Stand Details (indicate cue type and reason): cues for safer hand placement with standing up (pt attempting to pull up on RW) Stand to Sit: 5: Supervision;To bed;With upper extremity assist Stand to Sit Details: demo'd safe hand placement with sitting down Ambulation/Gait Ambulation/Gait Assistance: 5: Supervision Ambulation/Gait Assistance Details (indicate cue type and reason): SOB easily, less so with PLB (pursed lip breathing). demo's reciprocal steps with decreased step length bilaterally and fwd flexed trunk. heavy reliance on UE's with gait (pt reports feet/hands feel "numb" all the time) Ambulation Distance (Feet): 90 Feet Assistive device: Rolling walker;Other (Comment) (wide RW) Gait Pattern: Step-through pattern;Decreased stride length;Decreased step length - right;Decreased step length - left;Trunk  flexed Stairs: No Wheelchair Mobility Wheelchair Mobility: No  Posture/Postural Control Posture/Postural Control: No significant limitations    End of Session PT - End of Session Equipment Utilized During Treatment: Gait belt Activity Tolerance: Patient tolerated treatment well Patient left: in bed;with call bell in reach;with family/visitor present (seated on edge of bed) Nurse Communication: Mobility status for transfers;Mobility status for ambulation General Behavior During Session: [facility] for tasks performed Cognition: [facility] for tasks performed  Sallyanne Kuster 10/21/2011, 2:14 PM  Sallyanne Kuster, PTA Office- [phone]

## 2011-10-24 ENCOUNTER — Encounter (HOSPITAL_COMMUNITY): Payer: Self-pay | Admitting: Neurosurgery

## 2012-01-05 ENCOUNTER — Other Ambulatory Visit (HOSPITAL_COMMUNITY): Payer: Self-pay | Admitting: Neurosurgery

## 2012-11-02 IMAGING — CR DG HIP COMPLETE 2+V*R*
4 series · 4 of 4 positions shown · non-contrast
Comparison: None.

CLINICAL DATA: Status post fall; right lateral thigh pain.

RIGHT HIP - COMPLETE 2+ VIEW

[view not recorded (1 of 4)]
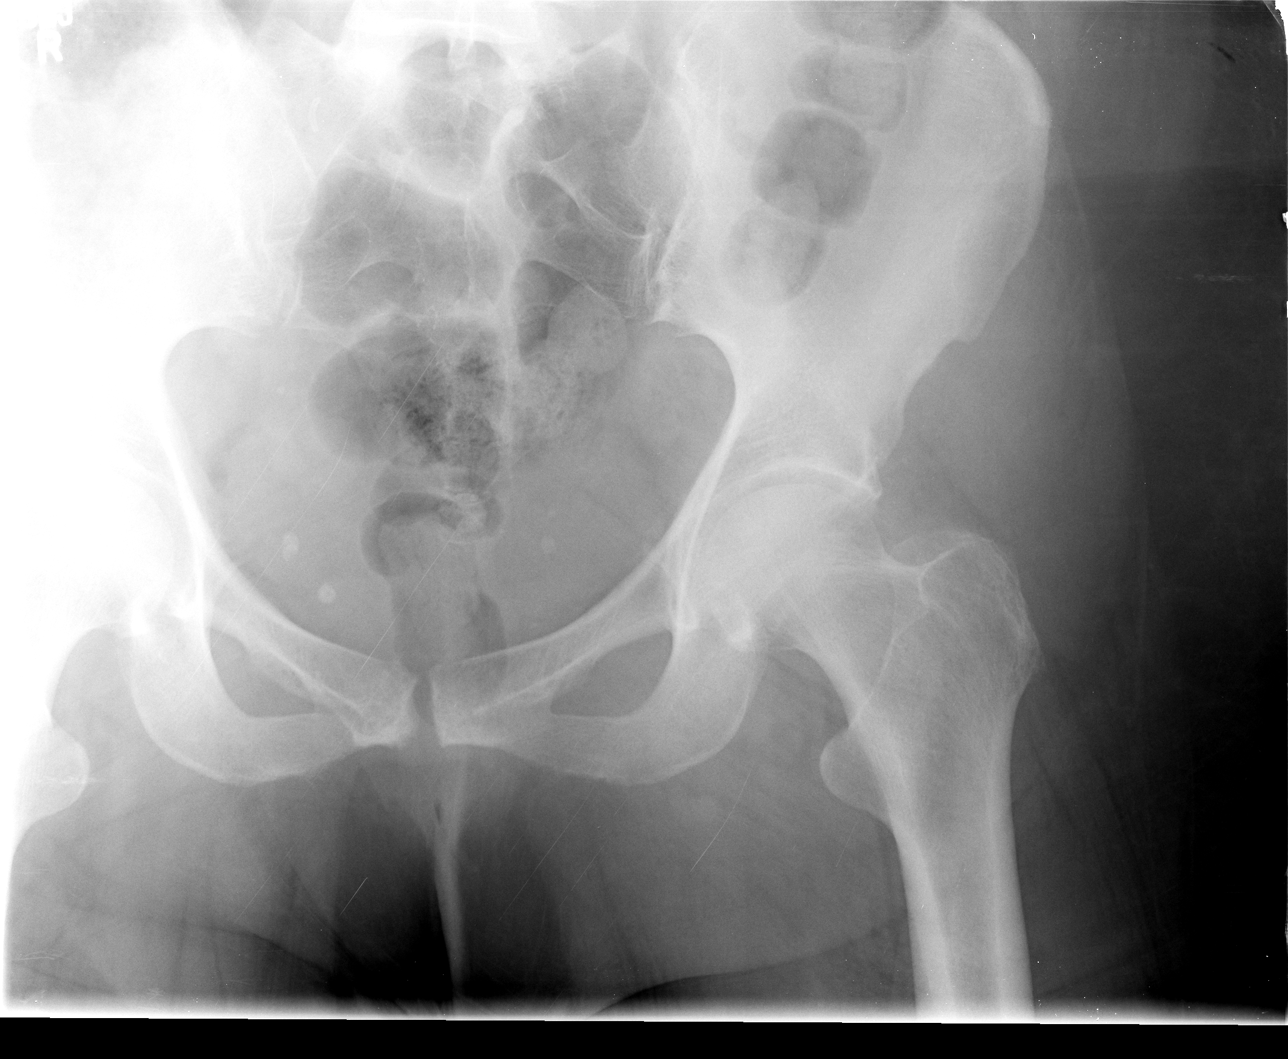

[view not recorded (2 of 4)]
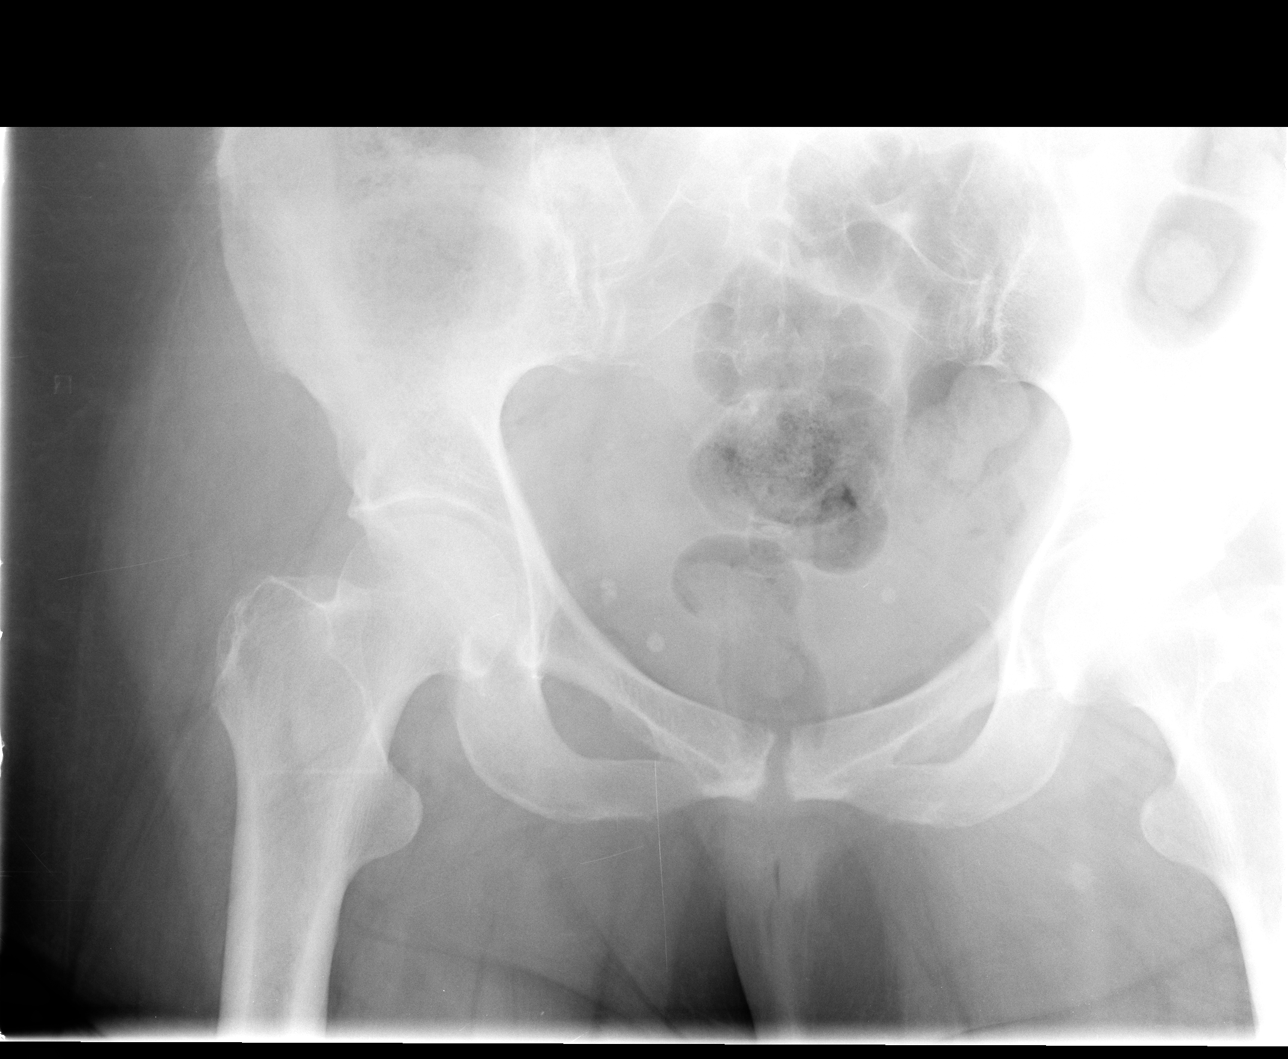

[view not recorded (3 of 4)]
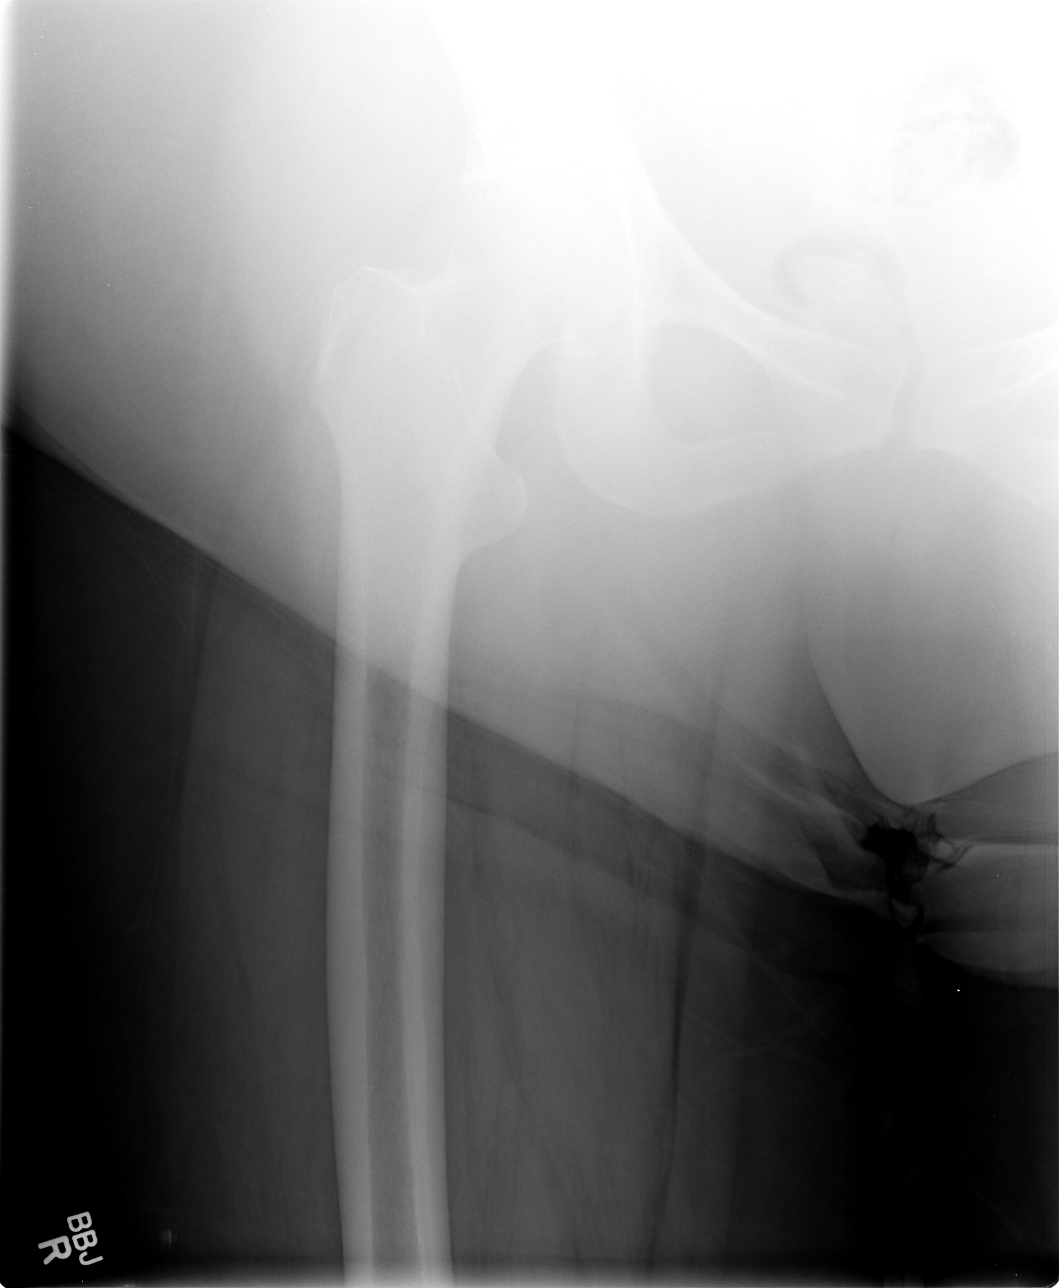

[view not recorded (4 of 4)]
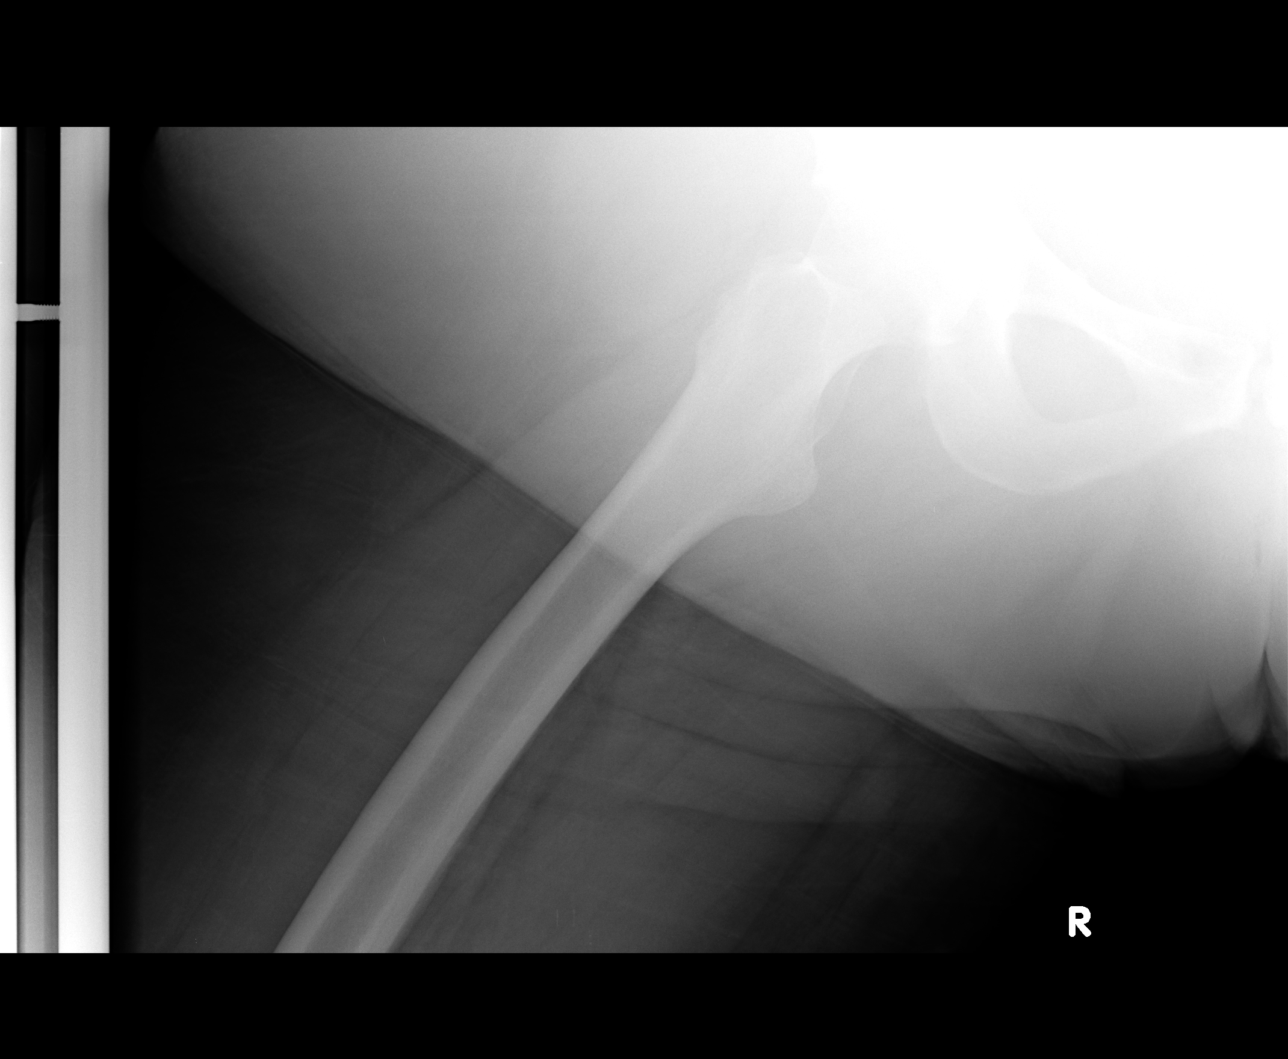

[4 of 4 positions shown; findings below may reference images not displayed]

FINDINGS: There is no evidence of fracture or dislocation.  Both
femoral heads are seated normally within their respective
acetabula.  The proximal right femur remains intact.  No
significant degenerative change is appreciated.  Mild sclerotic
change is noted at the sacroiliac joints.

The visualized bowel gas pattern is grossly unremarkable in
appearance.  Scattered phleboliths are noted within the pelvis.
IMPRESSION: No evidence of fracture or dislocation.

## 2013-02-15 IMAGING — CR DG CERVICAL SPINE COMPLETE 4+V
1 series · 1 of 1 positions shown · non-contrast
Comparison: 10/09/2011

CLINICAL DATA: C4-5 ACDF.

CERVICAL SPINE - COMPLETE 4+ VIEW

[view not recorded]
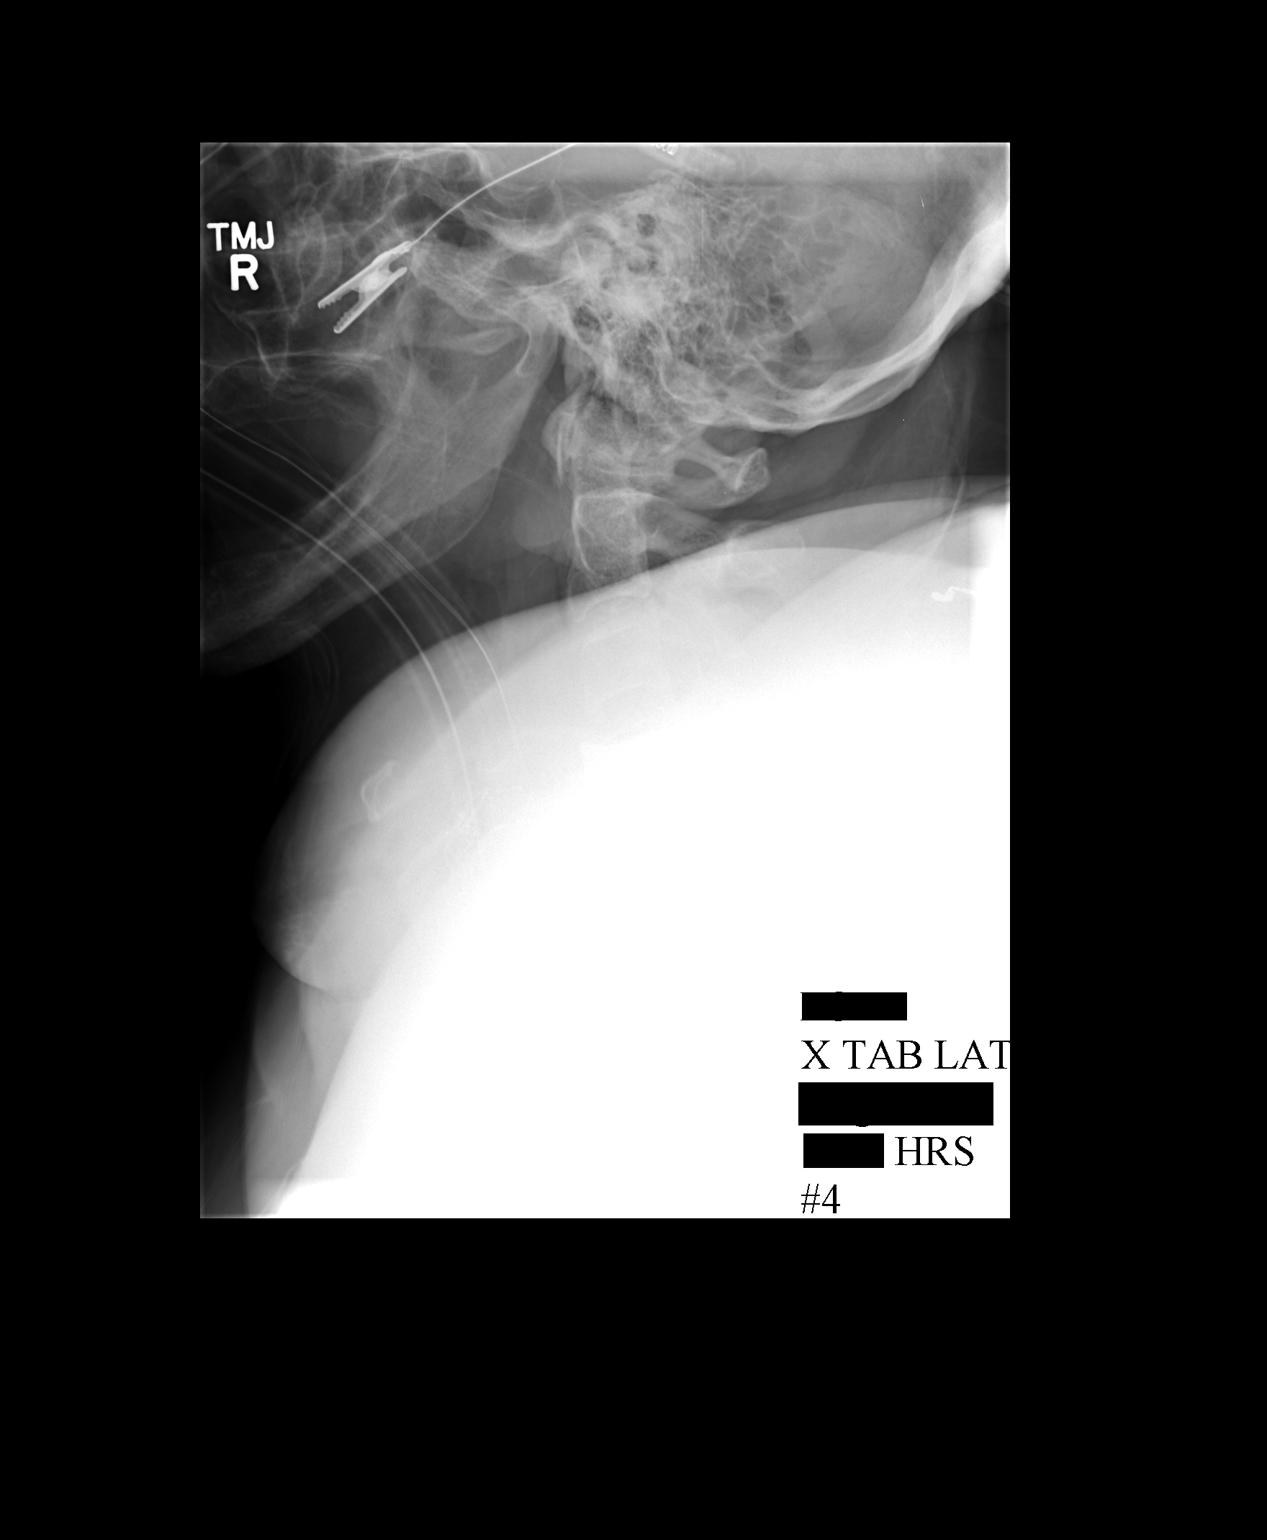

[1 of 1 positions shown; findings below may reference images not displayed]

FINDINGS: Image labeled #1 is severely limited due to overlying
shoulders.  Cannot visualize below the C2 level.  Localizing
instruments likely at approximately the C3-4 and C4-5 levels.

Film labeled #2 demonstrates anterior localizing instrument at but
likely C2-3 and C3-4.  Again, cannot visualize below the C2 level
due to overlying shoulders.

Film labeled #3 demonstrates localizing instruments at C2-3 and C3-
4.

Film labeled #4 demonstrates the superior aspect of an anterior
plate at the C4 level.  Cannot visualize the lobe the superior
aspect of C4 due to overlying shoulders.
IMPRESSION: Reported ACDF at C4-5.  The superior aspect of the plate can be
visualized as C4.  Cannot visualize below this level due to
overlying shoulders.

## 2014-02-18 DEATH — deceased

## 2014-03-04 ENCOUNTER — Telehealth: Payer: Self-pay

## 2014-03-04 NOTE — Telephone Encounter (Signed)
Patient died @ Home per Obituary °
